# Patient Record
Sex: Female | Born: 1960 | Race: Black or African American | Hispanic: No | Marital: Single | Smoking: Never smoker
Health system: Southern US, Community
[De-identification: ages and names within clinical notes are randomized; demographics above are authoritative.]

## PROBLEM LIST (undated history)

## (undated) DIAGNOSIS — F419 Anxiety disorder, unspecified: Secondary | ICD-10-CM

## (undated) DIAGNOSIS — J45909 Unspecified asthma, uncomplicated: Secondary | ICD-10-CM

## (undated) DIAGNOSIS — Z9289 Personal history of other medical treatment: Secondary | ICD-10-CM

## (undated) DIAGNOSIS — R51 Headache: Secondary | ICD-10-CM

## (undated) DIAGNOSIS — F329 Major depressive disorder, single episode, unspecified: Secondary | ICD-10-CM

## (undated) DIAGNOSIS — I209 Angina pectoris, unspecified: Secondary | ICD-10-CM

## (undated) DIAGNOSIS — R519 Headache, unspecified: Secondary | ICD-10-CM

## (undated) DIAGNOSIS — D649 Anemia, unspecified: Secondary | ICD-10-CM

## (undated) DIAGNOSIS — R7303 Prediabetes: Secondary | ICD-10-CM

## (undated) DIAGNOSIS — I10 Essential (primary) hypertension: Secondary | ICD-10-CM

## (undated) DIAGNOSIS — F32A Depression, unspecified: Secondary | ICD-10-CM

## (undated) DIAGNOSIS — R011 Cardiac murmur, unspecified: Secondary | ICD-10-CM

---

## 1997-10-17 HISTORY — PX: INCISION AND DRAINAGE: SHX5863

## 2008-10-17 HISTORY — PX: OTHER SURGICAL HISTORY: SHX169

## 2010-10-17 DIAGNOSIS — Z9289 Personal history of other medical treatment: Secondary | ICD-10-CM

## 2010-10-17 HISTORY — DX: Personal history of other medical treatment: Z92.89

## 2016-06-28 ENCOUNTER — Encounter: Payer: Self-pay | Admitting: Internal Medicine

## 2016-07-19 ENCOUNTER — Encounter: Payer: Self-pay | Admitting: Internal Medicine

## 2016-08-03 ENCOUNTER — Encounter: Payer: Self-pay | Admitting: Internal Medicine

## 2016-08-11 ENCOUNTER — Encounter: Payer: Self-pay | Admitting: Obstetrics & Gynecology

## 2016-08-12 NOTE — Patient Instructions (Signed)
Your procedure is scheduled on:  Thursday, Nov. 9, 2017  Enter through the [employer] [facility] at:  11:30 AM  Pick up the phone at the desk and dial [phone].  Call this number if you have problems the morning of surgery: [phone].  Remember: Do NOT eat food:  After Midnight Wednesday, Nov. 8, 2017  Do NOT drink clear liquids after:  9:00 AM day of surgery  Take these medicines the morning of surgery with a SIP OF WATER:  None  Stop ALL herbal medications at this time   Do NOT wear jewelry (body piercing), metal hair clips/bobby pins, make-up, or nail polish. Do NOT wear lotions, powders, or perfumes.  You may wear deodorant. Do NOT shave for 48 hours prior to surgery. Do NOT bring valuables to the hospital. Contacts, dentures, or bridgework may not be worn into surgery.  Have a responsible adult drive you home and stay with you for 24 hours after your procedure

## 2016-08-15 ENCOUNTER — Inpatient Hospital Stay (HOSPITAL_COMMUNITY)
Admission: RE | Admit: 2016-08-15 | Discharge: 2016-08-15 | Disposition: A | Discharge status: Home / Self Care | Payer: Self-pay | Source: Ambulatory Visit

## 2016-08-22 NOTE — H&P (Signed)
Taylor Chen is an 55 y.o. female with postmenopausal bleeding.  Ultrasound shows 27mm endometrial polyp.    Pertinent Gynecological History: Menses: post-menopausal Bleeding: post menopausal bleeding Contraception: none DES exposure: unknown Blood transfusions: none Sexually transmitted diseases: no past history Previous GYN Procedures: DNC  Last mammogram: normal Date: 2016 Last pap: normal Date: 06/2016 OB History: G0   Menstrual History: Menarche age: n/a No LMP recorded.    No past medical history on file.  No past surgical history on file.  No family history on file.  Social History:  has no tobacco, alcohol, and drug history on file.  Allergies:  Allergies  Allergen Reactions  . Penicillins Other (See Comments)    Has patient had a PCN reaction causing immediate rash, facial/tongue/throat swelling, SOB or lightheadedness with hypotension: no Has patient had a PCN reaction causing severe rash involving mucus membranes or skin necrosis: no Has patient had a PCN reaction that required hospitalization no Has patient had a PCN reaction occurring within the last 10 years: no If all of the above answers are "NO", then may proceed with Cephalosporin use. Reaction: yeast infection     No prescriptions prior to admission.    ROS  There were no vitals taken for this visit. Physical Exam  Constitutional: She is oriented to person, place, and time. She appears well-developed and well-nourished.  GI: Soft. There is no rebound and no guarding.  Neurological: She is alert and oriented to person, place, and time.  Skin: Skin is warm and dry.  Psychiatric: She has a normal mood and affect. Her behavior is normal.    No results found for this or any previous visit (from the past 24 hour(s)).  No results found.  Assessment/Plan: 55yo with PMB -H/S, D&C possible myosure -Patient has been counseled re: risk of bleeding, infection, scarring, and damage to surrounding  structures.  All questions were answered and the patient wishes to proceed.  Sawyer Kahan, [place] 08/22/2016, 8:46 PM

## 2016-08-23 NOTE — Patient Instructions (Addendum)
Your procedure is scheduled on:  Tomorrow, Nov. 9, 2017  Enter through the [employer] [facility] at:  11:30 AM  Pick up the phone at the desk and dial [phone].  Call this number if you have problems the morning of surgery: [phone].  Remember: Do NOT eat food:  After Midnight tonight  Do NOT drink clear liquids after:  9:00 AM day of surgery  Take these medicines the morning of surgery with a SIP OF WATER:  None  Stop ALL herbal medications at this time   Do NOT wear jewelry (body piercing), metal hair clips/bobby pins, make-up, or nail polish. Do NOT wear lotions, powders, or perfumes.  You may wear deodorant. Do NOT shave for 48 hours prior to surgery. Do NOT bring valuables to the hospital. Contacts, dentures, or bridgework may not be worn into surgery.  Have a responsible adult drive you home and stay with you for 24 hours after your procedure/

## 2016-08-24 ENCOUNTER — Encounter (HOSPITAL_COMMUNITY)
Admission: RE | Admit: 2016-08-24 | Discharge: 2016-08-24 | Disposition: A | Discharge status: Home / Self Care | Payer: BLUE CROSS/BLUE SHIELD | Source: Ambulatory Visit | Attending: Obstetrics & Gynecology | Admitting: Obstetrics & Gynecology

## 2016-08-24 ENCOUNTER — Other Ambulatory Visit: Payer: Self-pay

## 2016-08-24 ENCOUNTER — Encounter (HOSPITAL_COMMUNITY): Payer: Self-pay

## 2016-08-24 DIAGNOSIS — N840 Polyp of corpus uteri: Secondary | ICD-10-CM | POA: Diagnosis not present

## 2016-08-24 DIAGNOSIS — N950 Postmenopausal bleeding: Secondary | ICD-10-CM | POA: Diagnosis present

## 2016-08-24 DIAGNOSIS — Z79899 Other long term (current) drug therapy: Secondary | ICD-10-CM | POA: Diagnosis not present

## 2016-08-24 DIAGNOSIS — I10 Essential (primary) hypertension: Secondary | ICD-10-CM | POA: Diagnosis not present

## 2016-08-24 HISTORY — DX: Cardiac murmur, unspecified: R01.1

## 2016-08-24 HISTORY — DX: Angina pectoris, unspecified: I20.9

## 2016-08-24 HISTORY — DX: Unspecified asthma, uncomplicated: J45.909

## 2016-08-24 HISTORY — DX: Anemia, unspecified: D64.9

## 2016-08-24 HISTORY — DX: Anxiety disorder, unspecified: F41.9

## 2016-08-24 HISTORY — DX: Headache: R51

## 2016-08-24 HISTORY — DX: Headache, unspecified: R51.9

## 2016-08-24 HISTORY — DX: Depression, unspecified: F32.A

## 2016-08-24 HISTORY — DX: Personal history of other medical treatment: Z92.89

## 2016-08-24 HISTORY — DX: Essential (primary) hypertension: I10

## 2016-08-24 HISTORY — DX: Major depressive disorder, single episode, unspecified: F32.9

## 2016-08-24 LAB — CBC
HEMATOCRIT: 38.7 % (ref 36.0–46.0)
HEMOGLOBIN: 12.9 g/dL (ref 12.0–15.0)
MCH: 30.4 pg (ref 26.0–34.0)
MCHC: 33.3 g/dL (ref 30.0–36.0)
MCV: 91.1 fL (ref 78.0–100.0)
Platelets: 383 10*3/uL (ref 150–400)
RBC: 4.25 MIL/uL (ref 3.87–5.11)
RDW: 13.8 % (ref 11.5–15.5)
WBC: 8 10*3/uL (ref 4.0–10.5)

## 2016-08-24 LAB — TYPE AND SCREEN
ABO/RH(D): O POS
ANTIBODY SCREEN: NEGATIVE

## 2016-08-24 LAB — ABO/RH: ABO/RH(D): O POS

## 2016-08-24 NOTE — Pre-Procedure Instructions (Signed)
Dr. Conrad  aware of patient's c/o chest pain, mid sternal over the last 2 days. He reviewed  EKG and and was okay with results.

## 2016-08-25 ENCOUNTER — Ambulatory Visit (HOSPITAL_COMMUNITY): Payer: BLUE CROSS/BLUE SHIELD | Admitting: Anesthesiology

## 2016-08-25 ENCOUNTER — Encounter (HOSPITAL_COMMUNITY): Payer: Self-pay

## 2016-08-25 ENCOUNTER — Encounter (HOSPITAL_COMMUNITY)
Admission: RE | Disposition: A | Discharge status: Home / Self Care | Payer: Self-pay | Source: Ambulatory Visit | Attending: Obstetrics & Gynecology

## 2016-08-25 ENCOUNTER — Ambulatory Visit (HOSPITAL_COMMUNITY)
Admission: RE | Admit: 2016-08-25 | Discharge: 2016-08-25 | Disposition: A | Discharge status: Home / Self Care | Payer: BLUE CROSS/BLUE SHIELD | Source: Ambulatory Visit | Attending: Obstetrics & Gynecology | Admitting: Obstetrics & Gynecology

## 2016-08-25 DIAGNOSIS — I10 Essential (primary) hypertension: Secondary | ICD-10-CM | POA: Insufficient documentation

## 2016-08-25 DIAGNOSIS — Z79899 Other long term (current) drug therapy: Secondary | ICD-10-CM | POA: Insufficient documentation

## 2016-08-25 DIAGNOSIS — N840 Polyp of corpus uteri: Secondary | ICD-10-CM | POA: Insufficient documentation

## 2016-08-25 HISTORY — PX: DILATATION & CURETTAGE/HYSTEROSCOPY WITH MYOSURE: SHX6511

## 2016-08-25 SURGERY — DILATATION & CURETTAGE/HYSTEROSCOPY WITH MYOSURE
Anesthesia: General

## 2016-08-25 MED ORDER — DEXAMETHASONE SODIUM PHOSPHATE 10 MG/ML IJ SOLN
INTRAMUSCULAR | Status: DC | PRN
  Administered 2016-08-25: 4 mg via INTRAVENOUS

## 2016-08-25 MED ORDER — OXYCODONE HCL 5 MG/5ML PO SOLN: 5.0000 mg | Freq: Once | ORAL | Status: DC | PRN

## 2016-08-25 MED ORDER — LACTATED RINGERS IV SOLN: INTRAVENOUS | Status: DC

## 2016-08-25 MED ORDER — OXYCODONE-ACETAMINOPHEN 5-325 MG PO TABS: 1.0000 | ORAL_TABLET | ORAL | 0 refills | Status: DC | PRN

## 2016-08-25 MED ORDER — ONDANSETRON HCL 4 MG/2ML IJ SOLN
INTRAMUSCULAR | Status: DC | PRN
  Administered 2016-08-25: 4 mg via INTRAVENOUS

## 2016-08-25 MED ORDER — SODIUM CHLORIDE 0.9 % IR SOLN
Status: DC | PRN
  Administered 2016-08-25: 3000 mL

## 2016-08-25 MED ORDER — LIDOCAINE HCL (CARDIAC) 20 MG/ML IV SOLN
INTRAVENOUS | Status: DC | PRN
  Administered 2016-08-25: 100 mg via INTRAVENOUS

## 2016-08-25 MED ORDER — SCOPOLAMINE 1 MG/3DAYS TD PT72: 1.0000 | MEDICATED_PATCH | Freq: Once | TRANSDERMAL | Status: DC

## 2016-08-25 MED ORDER — LIDOCAINE HCL (CARDIAC) 20 MG/ML IV SOLN
INTRAVENOUS | Status: AC
  Filled 2016-08-25: qty 5

## 2016-08-25 MED ORDER — ONDANSETRON HCL 4 MG/2ML IJ SOLN
INTRAMUSCULAR | Status: AC
  Filled 2016-08-25: qty 2

## 2016-08-25 MED ORDER — FENTANYL CITRATE (PF) 100 MCG/2ML IJ SOLN
INTRAMUSCULAR | Status: DC
Start: 2016-08-25 — End: 2016-08-25
  Filled 2016-08-25: qty 2

## 2016-08-25 MED ORDER — IBUPROFEN 800 MG PO TABS: 800.0000 mg | ORAL_TABLET | Freq: Four times a day (QID) | ORAL | 0 refills | Status: DC | PRN

## 2016-08-25 MED ORDER — FENTANYL CITRATE (PF) 100 MCG/2ML IJ SOLN
INTRAMUSCULAR | Status: DC | PRN
  Administered 2016-08-25: 25 ug via INTRAVENOUS
  Administered 2016-08-25: 50 ug via INTRAVENOUS
  Administered 2016-08-25: 25 ug via INTRAVENOUS

## 2016-08-25 MED ORDER — OXYCODONE HCL 5 MG PO TABS: 5.0000 mg | ORAL_TABLET | Freq: Once | ORAL | Status: DC | PRN

## 2016-08-25 MED ORDER — MIDAZOLAM HCL 2 MG/2ML IJ SOLN
INTRAMUSCULAR | Status: AC
  Filled 2016-08-25: qty 2

## 2016-08-25 MED ORDER — MIDAZOLAM HCL 2 MG/2ML IJ SOLN
INTRAMUSCULAR | Status: DC | PRN
  Administered 2016-08-25: 2 mg via INTRAVENOUS

## 2016-08-25 MED ORDER — PROPOFOL 10 MG/ML IV BOLUS
INTRAVENOUS | Status: DC | PRN
  Administered 2016-08-25: 200 mg via INTRAVENOUS

## 2016-08-25 MED ORDER — FENTANYL CITRATE (PF) 100 MCG/2ML IJ SOLN
INTRAMUSCULAR | Status: AC
  Filled 2016-08-25: qty 2

## 2016-08-25 MED ORDER — FENTANYL CITRATE (PF) 100 MCG/2ML IJ SOLN
25.0000 ug | INTRAMUSCULAR | Status: DC | PRN
  Administered 2016-08-25: 25 ug via INTRAVENOUS

## 2016-08-25 MED ORDER — PROPOFOL 10 MG/ML IV BOLUS
INTRAVENOUS | Status: AC
  Filled 2016-08-25: qty 20

## 2016-08-25 MED ORDER — DEXAMETHASONE SODIUM PHOSPHATE 4 MG/ML IJ SOLN
INTRAMUSCULAR | Status: AC
  Filled 2016-08-25: qty 1

## 2016-08-25 MED ORDER — ONDANSETRON HCL 4 MG/2ML IJ SOLN: 4.0000 mg | Freq: Four times a day (QID) | INTRAMUSCULAR | Status: DC | PRN

## 2016-08-25 MED ORDER — LACTATED RINGERS IV SOLN
INTRAVENOUS | Status: DC
  Administered 2016-08-25: 13:00:00 via INTRAVENOUS

## 2016-08-25 SURGICAL SUPPLY — 22 items
CANISTER SUCT 3000ML (MISCELLANEOUS) ×4 IMPLANT
CATH ROBINSON RED A/P 16FR (CATHETERS) ×2 IMPLANT
CLOTH BEACON ORANGE TIMEOUT ST (SAFETY) ×2 IMPLANT
CONTAINER PREFILL 10% NBF 60ML (FORM) ×4 IMPLANT
DEVICE MYOSURE LITE (MISCELLANEOUS) ×2 IMPLANT
DEVICE MYOSURE REACH (MISCELLANEOUS) IMPLANT
ELECT REM PT RETURN 9FT ADLT (ELECTROSURGICAL)
ELECTRODE REM PT RTRN 9FT ADLT (ELECTROSURGICAL) IMPLANT
FILTER ARTHROSCOPY CONVERTOR (FILTER) ×2 IMPLANT
GLOVE BIO SURGEON STRL SZ 6 (GLOVE) ×2 IMPLANT
GLOVE BIOGEL PI IND STRL 6 (GLOVE) ×1 IMPLANT
GLOVE BIOGEL PI IND STRL 7.0 (GLOVE) ×1 IMPLANT
GLOVE BIOGEL PI INDICATOR 6 (GLOVE) ×1
GLOVE BIOGEL PI INDICATOR 7.0 (GLOVE) ×1
GOWN STRL REUS W/TWL LRG LVL3 (GOWN DISPOSABLE) ×4 IMPLANT
PACK VAGINAL MINOR WOMEN LF (CUSTOM PROCEDURE TRAY) ×2 IMPLANT
PAD OB MATERNITY 4.3X12.25 (PERSONAL CARE ITEMS) ×2 IMPLANT
SEAL ROD LENS SCOPE MYOSURE (ABLATOR) ×2 IMPLANT
TOWEL OR 17X24 6PK STRL BLUE (TOWEL DISPOSABLE) ×4 IMPLANT
TUBING AQUILEX INFLOW (TUBING) ×2 IMPLANT
TUBING AQUILEX OUTFLOW (TUBING) ×2 IMPLANT
WATER STERILE IRR 1000ML POUR (IV SOLUTION) ×2 IMPLANT

## 2016-08-25 NOTE — Anesthesia Preprocedure Evaluation (Signed)
Anesthesia Evaluation  Patient identified by MRN, date of birth, ID band Patient awake    Reviewed: Allergy & Precautions, H&P , NPO status , Patient's Chart, lab work & pertinent test results  Airway Mallampati: II   Neck ROM: full    Dental   Pulmonary asthma ,    breath sounds clear to auscultation       Cardiovascular hypertension, + angina  Rhythm:regular Rate:Normal     Neuro/Psych  Headaches, PSYCHIATRIC DISORDERS Anxiety Depression    GI/Hepatic   Endo/Other    Renal/GU      Musculoskeletal   Abdominal   Peds  Hematology   Anesthesia Other Findings   Reproductive/Obstetrics                             Anesthesia Physical Anesthesia Plan  ASA: II  Anesthesia Plan: General   Post-op Pain Management:    Induction: Intravenous  Airway Management Planned: LMA  Additional Equipment:   Intra-op Plan:   Post-operative Plan:   Informed Consent: I have reviewed the patients History and Physical, chart, labs and discussed the procedure including the risks, benefits and alternatives for the proposed anesthesia with the patient or authorized representative who has indicated his/her understanding and acceptance.     Plan Discussed with: CRNA, Anesthesiologist and Surgeon  Anesthesia Plan Comments:         Anesthesia Quick Evaluation

## 2016-08-25 NOTE — Transfer of Care (Signed)
Immediate Anesthesia Transfer of Care Note  Patient: Taylor Chen  Procedure(s) Performed: Procedure(s): DILATATION & CURETTAGE/HYSTEROSCOPY WITH MYOSURE (N/A)  Patient Location: PACU  Anesthesia Type:General  Level of Consciousness: awake, alert  and oriented  Airway & Oxygen Therapy: Patient Spontanous Breathing and Patient connected to nasal cannula oxygen  Post-op Assessment: Report given to RN, Post -op Vital signs reviewed and stable and Patient moving all extremities  Post vital signs: Reviewed and stable  Last Vitals:  Vitals:   08/25/16 1146  BP: (!) 149/99  Pulse: 86  Resp: 18  Temp: 36.9 C    Last Pain:  Vitals:   08/25/16 1146  TempSrc: Oral      Patients Stated Pain Goal: 2 ([id number])  Complications: No apparent anesthesia complications

## 2016-08-25 NOTE — Progress Notes (Signed)
No change to H&P.  Taylor Baumgarten, DO 

## 2016-08-25 NOTE — Anesthesia Procedure Notes (Signed)
Procedure Name: LMA Insertion Date/Time: 08/25/2016 12:56 PM Performed by: Hewitt Blade Pre-anesthesia Checklist: Patient identified, Patient being monitored, Emergency Drugs available and Suction available Patient Re-evaluated:Patient Re-evaluated prior to inductionOxygen Delivery Method: Circle system utilized Preoxygenation: Pre-oxygenation with 100% oxygen Intubation Type: IV induction LMA: LMA inserted LMA Size: 4.0 Number of attempts: 1 Placement Confirmation: positive ETCO2 and breath sounds checked- equal and bilateral Tube secured with: Tape Dental Injury: Teeth and Oropharynx as per pre-operative assessment

## 2016-08-25 NOTE — Op Note (Signed)
PREOPERATIVE DIAGNOSIS:  55 y.o. with postmenopausal bleeding  POSTOPERATIVE DIAGNOSIS: The same  PROCEDURE: Hysteroscopy, Dilation and Curettage, Myosure LITE  SURGEON:  Dr. Linda Hedges  INDICATIONS: 55 y.o. here for scheduled surgery for Hysteroscopy, D&C, Myosure.   Risks of surgery were discussed with the patient including but not limited to: bleeding which may require transfusion; infection which may require antibiotics; injury to uterus or surrounding organs; intrauterine scarring which may impair future fertility; need for additional procedures including laparotomy or laparoscopy; and other postoperative/anesthesia complications. Written informed consent was obtained.    FINDINGS:  A 6 week size uterus.  Overall atrophic appearing endometrium with fibroid on anterior wall. Normal ostia bilaterally.  ANESTHESIA:   General  ESTIMATED BLOOD LOSS:  Less than 20 ml  SPECIMENS: Endometrial curettings and Myosure specimen sent to pathology  COMPLICATIONS:  None immediate.  PROCEDURE DETAILS:  The patient was taken to the operating room where general anesthesia was administered and was found to be adequate.  After an adequate timeout was performed, she was placed in the dorsal lithotomy position and examined; then prepped and draped in the sterile manner.   Her bladder was catheterized for an unmeasured amount of clear, yellow urine. A speculum was then placed in the patient's vagina and a single tooth tenaculum was applied to the anterior lip of the cervix. The uteus was sounded to 7 cm and dilated manually with metal dilators to accommodate the Myosure hysteroscope.  Once the cervix was dilated, the hysteroscope was inserted under direct visualization using saline as a suspension medium.  The uterine cavity was carefully examined, both ostia were recognized and the above listed findings were noted.   After further careful visualization of the uterine cavity, the hysteroscope was removed under  direct visualization. The Myosure LITE was advanced and used to remove the intracavitary fibroid.  Fluid balance was 300cc when complete resection was noted.  Hysteroscope was removed and a sharp curettage was then performed to obtain a small amount of endometrial curettings.  The tenaculum was removed from the anterior lip of the cervix and the vaginal speculum was removed after noting good hemostasis.  The patient tolerated the procedure well and was taken to the recovery area awake, extubated and in stable condition.

## 2016-08-25 NOTE — Discharge Instructions (Addendum)
Call MD for T>100.4, heavy vaginal bleeding, severe abdominal pain, intractable nausea and/or vomiting, or respiratory distress.  Call office to schedule postop appointment in 2 weeks.  No driving while taking narcotics.  Pelvic rest x 6 weeks  DISCHARGE INSTRUCTIONS: HYSTEROSCOPY / ENDOMETRIAL ABLATION The following instructions have been prepared to help you care for yourself upon your return home.  May Remove Scop patch on or before: 11/12   May take stool softner while taking narcotic pain medication to prevent constipation.  Drink plenty of water.  Personal hygiene:  Use sanitary pads for vaginal drainage, not tampons.  Shower the day after your procedure.  NO tub baths, pools or Jacuzzis for 2-3 weeks.  Wipe front to back after using the bathroom.  Activity and limitations:  Do NOT drive or operate any equipment for 24 hours. The effects of anesthesia are still present and drowsiness may result.  Do NOT rest in bed all day.  Walking is encouraged.  Walk up and down stairs slowly.  You may resume your normal activity in one to two days or as indicated by your physician. Sexual activity: NO intercourse for at least 2 weeks after the procedure, or as indicated by your Doctor.  Diet: Eat a light meal as desired this evening. You may resume your usual diet tomorrow.  Return to Work: You may resume your work activities in one to two days or as indicated by Marine scientist.  What to expect after your surgery: Expect to have vaginal bleeding/discharge for 2-3 days and spotting for up to 10 days. It is not unusual to have soreness for up to 1-2 weeks. You may have a slight burning sensation when you urinate for the first day. Mild cramps may continue for a couple of days. You may have a regular period in 2-6 weeks.  Call your doctor for any of the following:  Excessive vaginal bleeding or clotting, saturating and changing one pad every hour.  Inability to urinate 6 hours  after discharge from hospital.  Pain not relieved by pain medication.  Fever of 100.4 F or greater.  Unusual vaginal discharge or odor.  Return to office _________________Call for an appointment ___________________ Patients signature: ______________________ Nurses signature ________________________  Post Anesthesia Care Unit [phone]   Post Anesthesia Home Care Instructions  Activity: Get plenty of rest for the remainder of the day. A responsible adult should stay with you for 24 hours following the procedure.  For the next 24 hours, DO NOT: -Drive a car -Paediatric nurse -Drink alcoholic beverages -Take any medication unless instructed by your physician -Make any legal decisions or sign important papers.  Meals: Start with liquid foods such as gelatin or soup. Progress to regular foods as tolerated. Avoid greasy, spicy, heavy foods. If nausea and/or vomiting occur, drink only clear liquids until the nausea and/or vomiting subsides. Call your physician if vomiting continues.  Special Instructions/Symptoms: Your throat may feel dry or sore from the anesthesia or the breathing tube placed in your throat during surgery. If this causes discomfort, gargle with warm salt water. The discomfort should disappear within 24 hours.  If you had a scopolamine patch placed behind your ear for the management of post- operative nausea and/or vomiting:  1. The medication in the patch is effective for 72 hours, after which it should be removed.  Wrap patch in a tissue and discard in the trash. Wash hands thoroughly with soap and water. 2. You may remove the patch earlier than 72 hours if  you experience unpleasant side effects which may include dry mouth, dizziness or visual disturbances. 3. Avoid touching the patch. Wash your hands with soap and water after contact with the patch.   Marland Kitchen

## 2016-08-26 NOTE — Anesthesia Postprocedure Evaluation (Signed)
Anesthesia Post Note  Patient: Taylor Chen  Procedure(s) Performed: Procedure(s) (LRB): DILATATION & CURETTAGE/HYSTEROSCOPY WITH MYOSURE (N/A)  Patient location during evaluation: PACU Anesthesia Type: General Level of consciousness: awake and alert and patient cooperative Pain management: pain level controlled Vital Signs Assessment: post-procedure vital signs reviewed and stable Respiratory status: spontaneous breathing and respiratory function stable Cardiovascular status: stable Anesthetic complications: no    Last Vitals:  Vitals:   08/25/16 1430 08/25/16 1520  BP: (!) 150/96 (!) 142/85  Pulse: 83 68  Resp: 11 16  Temp: 36.7 C 36.6 C    Last Pain:  Vitals:   08/25/16 1520  TempSrc:   PainSc: 0-No pain                 Tekeshia Klahr S

## 2016-08-28 ENCOUNTER — Encounter (HOSPITAL_COMMUNITY): Payer: Self-pay | Admitting: Obstetrics & Gynecology

## 2016-09-28 ENCOUNTER — Encounter: Payer: Self-pay | Admitting: Internal Medicine

## 2017-05-26 ENCOUNTER — Ambulatory Visit: Payer: BLUE CROSS/BLUE SHIELD | Admitting: Psychology

## 2017-06-02 ENCOUNTER — Ambulatory Visit: Payer: BLUE CROSS/BLUE SHIELD | Admitting: Psychology

## 2017-06-02 ENCOUNTER — Ambulatory Visit (INDEPENDENT_AMBULATORY_CARE_PROVIDER_SITE_OTHER): Payer: BLUE CROSS/BLUE SHIELD | Admitting: Psychology

## 2017-06-02 DIAGNOSIS — F331 Major depressive disorder, recurrent, moderate: Secondary | ICD-10-CM | POA: Diagnosis not present

## 2017-07-21 ENCOUNTER — Ambulatory Visit (INDEPENDENT_AMBULATORY_CARE_PROVIDER_SITE_OTHER): Payer: BLUE CROSS/BLUE SHIELD | Admitting: Psychology

## 2017-07-21 DIAGNOSIS — F331 Major depressive disorder, recurrent, moderate: Secondary | ICD-10-CM | POA: Diagnosis not present

## 2017-07-26 DIAGNOSIS — F411 Generalized anxiety disorder: Secondary | ICD-10-CM | POA: Diagnosis not present

## 2017-07-28 ENCOUNTER — Ambulatory Visit: Payer: BLUE CROSS/BLUE SHIELD | Admitting: Psychology

## 2017-08-04 ENCOUNTER — Ambulatory Visit: Payer: Self-pay | Admitting: Psychology

## 2017-08-11 ENCOUNTER — Ambulatory Visit: Payer: Self-pay | Admitting: Psychology

## 2017-08-18 ENCOUNTER — Ambulatory Visit: Payer: Self-pay | Admitting: Psychology

## 2017-08-23 DIAGNOSIS — F411 Generalized anxiety disorder: Secondary | ICD-10-CM | POA: Diagnosis not present

## 2017-08-25 ENCOUNTER — Ambulatory Visit: Payer: Self-pay | Admitting: Psychology

## 2017-08-25 DIAGNOSIS — H2513 Age-related nuclear cataract, bilateral: Secondary | ICD-10-CM | POA: Diagnosis not present

## 2017-08-25 DIAGNOSIS — H40022 Open angle with borderline findings, high risk, left eye: Secondary | ICD-10-CM | POA: Diagnosis not present

## 2017-08-25 DIAGNOSIS — H1013 Acute atopic conjunctivitis, bilateral: Secondary | ICD-10-CM | POA: Diagnosis not present

## 2017-08-25 DIAGNOSIS — H40023 Open angle with borderline findings, high risk, bilateral: Secondary | ICD-10-CM | POA: Diagnosis not present

## 2017-08-25 DIAGNOSIS — H25013 Cortical age-related cataract, bilateral: Secondary | ICD-10-CM | POA: Diagnosis not present

## 2017-08-25 DIAGNOSIS — H40021 Open angle with borderline findings, high risk, right eye: Secondary | ICD-10-CM | POA: Diagnosis not present

## 2017-09-01 ENCOUNTER — Ambulatory Visit: Payer: Self-pay | Admitting: Psychology

## 2017-09-08 ENCOUNTER — Ambulatory Visit: Payer: Self-pay | Admitting: Psychology

## 2017-09-15 ENCOUNTER — Ambulatory Visit: Payer: Self-pay | Admitting: Psychology

## 2017-09-15 ENCOUNTER — Ambulatory Visit: Payer: BLUE CROSS/BLUE SHIELD | Admitting: Psychology

## 2017-09-22 ENCOUNTER — Ambulatory Visit: Payer: BLUE CROSS/BLUE SHIELD | Admitting: Psychology

## 2017-09-29 ENCOUNTER — Ambulatory Visit: Payer: Self-pay | Admitting: Psychology

## 2017-10-04 DIAGNOSIS — F411 Generalized anxiety disorder: Secondary | ICD-10-CM | POA: Diagnosis not present

## 2017-10-06 ENCOUNTER — Ambulatory Visit: Payer: Self-pay | Admitting: Psychology

## 2017-10-06 DIAGNOSIS — I10 Essential (primary) hypertension: Secondary | ICD-10-CM | POA: Diagnosis not present

## 2017-10-06 DIAGNOSIS — E7800 Pure hypercholesterolemia, unspecified: Secondary | ICD-10-CM | POA: Diagnosis not present

## 2017-10-06 DIAGNOSIS — Z1231 Encounter for screening mammogram for malignant neoplasm of breast: Secondary | ICD-10-CM | POA: Diagnosis not present

## 2017-10-11 ENCOUNTER — Other Ambulatory Visit: Payer: Self-pay | Admitting: Family Medicine

## 2017-10-11 DIAGNOSIS — Z139 Encounter for screening, unspecified: Secondary | ICD-10-CM

## 2017-10-13 ENCOUNTER — Ambulatory Visit: Payer: Self-pay | Admitting: Psychology

## 2017-10-18 DIAGNOSIS — F411 Generalized anxiety disorder: Secondary | ICD-10-CM | POA: Diagnosis not present

## 2017-10-25 DIAGNOSIS — F411 Generalized anxiety disorder: Secondary | ICD-10-CM | POA: Diagnosis not present

## 2017-10-30 DIAGNOSIS — F411 Generalized anxiety disorder: Secondary | ICD-10-CM | POA: Diagnosis not present

## 2017-11-06 ENCOUNTER — Ambulatory Visit: Payer: Self-pay

## 2017-11-22 ENCOUNTER — Ambulatory Visit
Admission: RE | Admit: 2017-11-22 | Discharge: 2017-11-22 | Disposition: A | Discharge status: Home / Self Care | Payer: BLUE CROSS/BLUE SHIELD | Source: Ambulatory Visit | Attending: Family Medicine | Admitting: Family Medicine

## 2017-11-22 ENCOUNTER — Other Ambulatory Visit: Payer: Self-pay | Admitting: Family Medicine

## 2017-11-22 DIAGNOSIS — Z139 Encounter for screening, unspecified: Secondary | ICD-10-CM

## 2017-11-22 DIAGNOSIS — Z1231 Encounter for screening mammogram for malignant neoplasm of breast: Secondary | ICD-10-CM | POA: Diagnosis not present

## 2017-12-19 DIAGNOSIS — S058X2A Other injuries of left eye and orbit, initial encounter: Secondary | ICD-10-CM | POA: Diagnosis not present

## 2018-01-26 DIAGNOSIS — F4310 Post-traumatic stress disorder, unspecified: Secondary | ICD-10-CM | POA: Diagnosis not present

## 2018-02-23 DIAGNOSIS — H40023 Open angle with borderline findings, high risk, bilateral: Secondary | ICD-10-CM | POA: Diagnosis not present

## 2018-02-23 DIAGNOSIS — F4310 Post-traumatic stress disorder, unspecified: Secondary | ICD-10-CM | POA: Diagnosis not present

## 2018-04-09 DIAGNOSIS — Z1211 Encounter for screening for malignant neoplasm of colon: Secondary | ICD-10-CM | POA: Diagnosis not present

## 2018-04-09 DIAGNOSIS — E7800 Pure hypercholesterolemia, unspecified: Secondary | ICD-10-CM | POA: Diagnosis not present

## 2018-04-09 DIAGNOSIS — F439 Reaction to severe stress, unspecified: Secondary | ICD-10-CM | POA: Diagnosis not present

## 2018-04-09 DIAGNOSIS — I10 Essential (primary) hypertension: Secondary | ICD-10-CM | POA: Diagnosis not present

## 2018-04-16 DIAGNOSIS — Z01419 Encounter for gynecological examination (general) (routine) without abnormal findings: Secondary | ICD-10-CM | POA: Diagnosis not present

## 2018-04-16 DIAGNOSIS — Z6831 Body mass index (BMI) 31.0-31.9, adult: Secondary | ICD-10-CM | POA: Diagnosis not present

## 2018-08-02 DIAGNOSIS — F332 Major depressive disorder, recurrent severe without psychotic features: Secondary | ICD-10-CM | POA: Diagnosis not present

## 2018-08-02 DIAGNOSIS — F411 Generalized anxiety disorder: Secondary | ICD-10-CM | POA: Diagnosis not present

## 2018-08-22 DIAGNOSIS — F411 Generalized anxiety disorder: Secondary | ICD-10-CM | POA: Diagnosis not present

## 2018-08-22 DIAGNOSIS — F332 Major depressive disorder, recurrent severe without psychotic features: Secondary | ICD-10-CM | POA: Diagnosis not present

## 2018-10-02 DIAGNOSIS — F332 Major depressive disorder, recurrent severe without psychotic features: Secondary | ICD-10-CM | POA: Diagnosis not present

## 2018-10-02 DIAGNOSIS — F411 Generalized anxiety disorder: Secondary | ICD-10-CM | POA: Diagnosis not present

## 2018-10-05 DIAGNOSIS — H25013 Cortical age-related cataract, bilateral: Secondary | ICD-10-CM | POA: Diagnosis not present

## 2018-10-05 DIAGNOSIS — H2513 Age-related nuclear cataract, bilateral: Secondary | ICD-10-CM | POA: Diagnosis not present

## 2018-10-05 DIAGNOSIS — H40023 Open angle with borderline findings, high risk, bilateral: Secondary | ICD-10-CM | POA: Diagnosis not present

## 2018-10-05 DIAGNOSIS — H524 Presbyopia: Secondary | ICD-10-CM | POA: Diagnosis not present

## 2018-10-05 DIAGNOSIS — H1013 Acute atopic conjunctivitis, bilateral: Secondary | ICD-10-CM | POA: Diagnosis not present

## 2018-10-18 DIAGNOSIS — F4323 Adjustment disorder with mixed anxiety and depressed mood: Secondary | ICD-10-CM | POA: Diagnosis not present

## 2018-10-23 ENCOUNTER — Other Ambulatory Visit: Payer: Self-pay | Admitting: Family Medicine

## 2018-10-23 DIAGNOSIS — Z1231 Encounter for screening mammogram for malignant neoplasm of breast: Secondary | ICD-10-CM

## 2018-10-23 DIAGNOSIS — F4323 Adjustment disorder with mixed anxiety and depressed mood: Secondary | ICD-10-CM | POA: Diagnosis not present

## 2018-10-29 DIAGNOSIS — F4323 Adjustment disorder with mixed anxiety and depressed mood: Secondary | ICD-10-CM | POA: Diagnosis not present

## 2018-10-31 DIAGNOSIS — F332 Major depressive disorder, recurrent severe without psychotic features: Secondary | ICD-10-CM | POA: Diagnosis not present

## 2018-10-31 DIAGNOSIS — F411 Generalized anxiety disorder: Secondary | ICD-10-CM | POA: Diagnosis not present

## 2018-11-05 DIAGNOSIS — F4323 Adjustment disorder with mixed anxiety and depressed mood: Secondary | ICD-10-CM | POA: Diagnosis not present

## 2018-11-14 DIAGNOSIS — F332 Major depressive disorder, recurrent severe without psychotic features: Secondary | ICD-10-CM | POA: Diagnosis not present

## 2018-11-14 DIAGNOSIS — F411 Generalized anxiety disorder: Secondary | ICD-10-CM | POA: Diagnosis not present

## 2018-11-19 DIAGNOSIS — F4323 Adjustment disorder with mixed anxiety and depressed mood: Secondary | ICD-10-CM | POA: Diagnosis not present

## 2018-11-23 ENCOUNTER — Ambulatory Visit
Admission: RE | Admit: 2018-11-23 | Discharge: 2018-11-23 | Disposition: A | Discharge status: Home / Self Care | Payer: BLUE CROSS/BLUE SHIELD | Source: Ambulatory Visit | Attending: Family Medicine | Admitting: Family Medicine

## 2018-11-23 DIAGNOSIS — Z1231 Encounter for screening mammogram for malignant neoplasm of breast: Secondary | ICD-10-CM

## 2018-11-23 DIAGNOSIS — F4323 Adjustment disorder with mixed anxiety and depressed mood: Secondary | ICD-10-CM | POA: Diagnosis not present

## 2018-11-26 DIAGNOSIS — F4323 Adjustment disorder with mixed anxiety and depressed mood: Secondary | ICD-10-CM | POA: Diagnosis not present

## 2018-11-29 DIAGNOSIS — F411 Generalized anxiety disorder: Secondary | ICD-10-CM | POA: Diagnosis not present

## 2018-11-29 DIAGNOSIS — F332 Major depressive disorder, recurrent severe without psychotic features: Secondary | ICD-10-CM | POA: Diagnosis not present

## 2018-12-03 DIAGNOSIS — F4323 Adjustment disorder with mixed anxiety and depressed mood: Secondary | ICD-10-CM | POA: Diagnosis not present

## 2018-12-06 DIAGNOSIS — F332 Major depressive disorder, recurrent severe without psychotic features: Secondary | ICD-10-CM | POA: Diagnosis not present

## 2018-12-06 DIAGNOSIS — F411 Generalized anxiety disorder: Secondary | ICD-10-CM | POA: Diagnosis not present

## 2018-12-10 DIAGNOSIS — F4323 Adjustment disorder with mixed anxiety and depressed mood: Secondary | ICD-10-CM | POA: Diagnosis not present

## 2018-12-28 DIAGNOSIS — E7800 Pure hypercholesterolemia, unspecified: Secondary | ICD-10-CM | POA: Diagnosis not present

## 2018-12-28 DIAGNOSIS — M79672 Pain in left foot: Secondary | ICD-10-CM | POA: Diagnosis not present

## 2018-12-28 DIAGNOSIS — I10 Essential (primary) hypertension: Secondary | ICD-10-CM | POA: Diagnosis not present

## 2019-01-03 DIAGNOSIS — F332 Major depressive disorder, recurrent severe without psychotic features: Secondary | ICD-10-CM | POA: Diagnosis not present

## 2019-01-03 DIAGNOSIS — F411 Generalized anxiety disorder: Secondary | ICD-10-CM | POA: Diagnosis not present

## 2019-01-10 DIAGNOSIS — F332 Major depressive disorder, recurrent severe without psychotic features: Secondary | ICD-10-CM | POA: Diagnosis not present

## 2019-01-10 DIAGNOSIS — F411 Generalized anxiety disorder: Secondary | ICD-10-CM | POA: Diagnosis not present

## 2019-01-15 DIAGNOSIS — F4323 Adjustment disorder with mixed anxiety and depressed mood: Secondary | ICD-10-CM | POA: Diagnosis not present

## 2019-01-17 DIAGNOSIS — F411 Generalized anxiety disorder: Secondary | ICD-10-CM | POA: Diagnosis not present

## 2019-01-17 DIAGNOSIS — F332 Major depressive disorder, recurrent severe without psychotic features: Secondary | ICD-10-CM | POA: Diagnosis not present

## 2019-01-21 DIAGNOSIS — F4323 Adjustment disorder with mixed anxiety and depressed mood: Secondary | ICD-10-CM | POA: Diagnosis not present

## 2019-01-28 DIAGNOSIS — F4323 Adjustment disorder with mixed anxiety and depressed mood: Secondary | ICD-10-CM | POA: Diagnosis not present

## 2019-01-31 DIAGNOSIS — M722 Plantar fascial fibromatosis: Secondary | ICD-10-CM | POA: Diagnosis not present

## 2019-01-31 DIAGNOSIS — M79672 Pain in left foot: Secondary | ICD-10-CM | POA: Diagnosis not present

## 2019-02-04 DIAGNOSIS — F4323 Adjustment disorder with mixed anxiety and depressed mood: Secondary | ICD-10-CM | POA: Diagnosis not present

## 2019-02-07 DIAGNOSIS — F411 Generalized anxiety disorder: Secondary | ICD-10-CM | POA: Diagnosis not present

## 2019-02-07 DIAGNOSIS — F332 Major depressive disorder, recurrent severe without psychotic features: Secondary | ICD-10-CM | POA: Diagnosis not present

## 2019-02-09 DIAGNOSIS — F4323 Adjustment disorder with mixed anxiety and depressed mood: Secondary | ICD-10-CM | POA: Diagnosis not present

## 2019-02-11 DIAGNOSIS — F4323 Adjustment disorder with mixed anxiety and depressed mood: Secondary | ICD-10-CM | POA: Diagnosis not present

## 2019-02-14 DIAGNOSIS — F332 Major depressive disorder, recurrent severe without psychotic features: Secondary | ICD-10-CM | POA: Diagnosis not present

## 2019-02-14 DIAGNOSIS — F411 Generalized anxiety disorder: Secondary | ICD-10-CM | POA: Diagnosis not present

## 2019-02-21 DIAGNOSIS — F411 Generalized anxiety disorder: Secondary | ICD-10-CM | POA: Diagnosis not present

## 2019-02-21 DIAGNOSIS — F332 Major depressive disorder, recurrent severe without psychotic features: Secondary | ICD-10-CM | POA: Diagnosis not present

## 2019-02-28 DIAGNOSIS — F332 Major depressive disorder, recurrent severe without psychotic features: Secondary | ICD-10-CM | POA: Diagnosis not present

## 2019-02-28 DIAGNOSIS — F411 Generalized anxiety disorder: Secondary | ICD-10-CM | POA: Diagnosis not present

## 2019-03-07 DIAGNOSIS — F411 Generalized anxiety disorder: Secondary | ICD-10-CM | POA: Diagnosis not present

## 2019-03-07 DIAGNOSIS — F332 Major depressive disorder, recurrent severe without psychotic features: Secondary | ICD-10-CM | POA: Diagnosis not present

## 2019-03-14 DIAGNOSIS — F411 Generalized anxiety disorder: Secondary | ICD-10-CM | POA: Diagnosis not present

## 2019-03-14 DIAGNOSIS — F332 Major depressive disorder, recurrent severe without psychotic features: Secondary | ICD-10-CM | POA: Diagnosis not present

## 2019-03-21 DIAGNOSIS — F332 Major depressive disorder, recurrent severe without psychotic features: Secondary | ICD-10-CM | POA: Diagnosis not present

## 2019-03-21 DIAGNOSIS — F411 Generalized anxiety disorder: Secondary | ICD-10-CM | POA: Diagnosis not present

## 2019-03-25 DIAGNOSIS — M79672 Pain in left foot: Secondary | ICD-10-CM | POA: Diagnosis not present

## 2019-03-28 DIAGNOSIS — F411 Generalized anxiety disorder: Secondary | ICD-10-CM | POA: Diagnosis not present

## 2019-03-28 DIAGNOSIS — F332 Major depressive disorder, recurrent severe without psychotic features: Secondary | ICD-10-CM | POA: Diagnosis not present

## 2019-04-01 DIAGNOSIS — H40023 Open angle with borderline findings, high risk, bilateral: Secondary | ICD-10-CM | POA: Diagnosis not present

## 2019-04-01 DIAGNOSIS — H25013 Cortical age-related cataract, bilateral: Secondary | ICD-10-CM | POA: Diagnosis not present

## 2019-04-01 DIAGNOSIS — F411 Generalized anxiety disorder: Secondary | ICD-10-CM | POA: Diagnosis not present

## 2019-04-01 DIAGNOSIS — F332 Major depressive disorder, recurrent severe without psychotic features: Secondary | ICD-10-CM | POA: Diagnosis not present

## 2019-04-04 DIAGNOSIS — F332 Major depressive disorder, recurrent severe without psychotic features: Secondary | ICD-10-CM | POA: Diagnosis not present

## 2019-04-04 DIAGNOSIS — F411 Generalized anxiety disorder: Secondary | ICD-10-CM | POA: Diagnosis not present

## 2019-04-08 DIAGNOSIS — F332 Major depressive disorder, recurrent severe without psychotic features: Secondary | ICD-10-CM | POA: Diagnosis not present

## 2019-04-08 DIAGNOSIS — F411 Generalized anxiety disorder: Secondary | ICD-10-CM | POA: Diagnosis not present

## 2019-04-11 DIAGNOSIS — F411 Generalized anxiety disorder: Secondary | ICD-10-CM | POA: Diagnosis not present

## 2019-04-11 DIAGNOSIS — F332 Major depressive disorder, recurrent severe without psychotic features: Secondary | ICD-10-CM | POA: Diagnosis not present

## 2019-04-25 DIAGNOSIS — F411 Generalized anxiety disorder: Secondary | ICD-10-CM | POA: Diagnosis not present

## 2019-04-25 DIAGNOSIS — F332 Major depressive disorder, recurrent severe without psychotic features: Secondary | ICD-10-CM | POA: Diagnosis not present

## 2019-04-26 DIAGNOSIS — M549 Dorsalgia, unspecified: Secondary | ICD-10-CM | POA: Diagnosis not present

## 2019-04-26 DIAGNOSIS — M545 Low back pain: Secondary | ICD-10-CM | POA: Diagnosis not present

## 2019-04-29 DIAGNOSIS — F332 Major depressive disorder, recurrent severe without psychotic features: Secondary | ICD-10-CM | POA: Diagnosis not present

## 2019-04-29 DIAGNOSIS — F411 Generalized anxiety disorder: Secondary | ICD-10-CM | POA: Diagnosis not present

## 2019-05-02 DIAGNOSIS — M546 Pain in thoracic spine: Secondary | ICD-10-CM | POA: Diagnosis not present

## 2019-05-02 DIAGNOSIS — M542 Cervicalgia: Secondary | ICD-10-CM | POA: Diagnosis not present

## 2019-05-02 DIAGNOSIS — M545 Low back pain: Secondary | ICD-10-CM | POA: Diagnosis not present

## 2019-05-02 DIAGNOSIS — F411 Generalized anxiety disorder: Secondary | ICD-10-CM | POA: Diagnosis not present

## 2019-05-02 DIAGNOSIS — F332 Major depressive disorder, recurrent severe without psychotic features: Secondary | ICD-10-CM | POA: Diagnosis not present

## 2019-05-02 DIAGNOSIS — M25531 Pain in right wrist: Secondary | ICD-10-CM | POA: Diagnosis not present

## 2019-05-06 DIAGNOSIS — F332 Major depressive disorder, recurrent severe without psychotic features: Secondary | ICD-10-CM | POA: Diagnosis not present

## 2019-05-06 DIAGNOSIS — F411 Generalized anxiety disorder: Secondary | ICD-10-CM | POA: Diagnosis not present

## 2019-05-09 DIAGNOSIS — F332 Major depressive disorder, recurrent severe without psychotic features: Secondary | ICD-10-CM | POA: Diagnosis not present

## 2019-05-09 DIAGNOSIS — F411 Generalized anxiety disorder: Secondary | ICD-10-CM | POA: Diagnosis not present

## 2019-05-13 DIAGNOSIS — F411 Generalized anxiety disorder: Secondary | ICD-10-CM | POA: Diagnosis not present

## 2019-05-13 DIAGNOSIS — F332 Major depressive disorder, recurrent severe without psychotic features: Secondary | ICD-10-CM | POA: Diagnosis not present

## 2019-05-16 DIAGNOSIS — F411 Generalized anxiety disorder: Secondary | ICD-10-CM | POA: Diagnosis not present

## 2019-05-16 DIAGNOSIS — F332 Major depressive disorder, recurrent severe without psychotic features: Secondary | ICD-10-CM | POA: Diagnosis not present

## 2019-05-23 DIAGNOSIS — F411 Generalized anxiety disorder: Secondary | ICD-10-CM | POA: Diagnosis not present

## 2019-05-23 DIAGNOSIS — F332 Major depressive disorder, recurrent severe without psychotic features: Secondary | ICD-10-CM | POA: Diagnosis not present

## 2019-05-30 DIAGNOSIS — F332 Major depressive disorder, recurrent severe without psychotic features: Secondary | ICD-10-CM | POA: Diagnosis not present

## 2019-05-30 DIAGNOSIS — F411 Generalized anxiety disorder: Secondary | ICD-10-CM | POA: Diagnosis not present

## 2019-06-03 DIAGNOSIS — F411 Generalized anxiety disorder: Secondary | ICD-10-CM | POA: Diagnosis not present

## 2019-06-03 DIAGNOSIS — F332 Major depressive disorder, recurrent severe without psychotic features: Secondary | ICD-10-CM | POA: Diagnosis not present

## 2019-06-06 DIAGNOSIS — F411 Generalized anxiety disorder: Secondary | ICD-10-CM | POA: Diagnosis not present

## 2019-06-06 DIAGNOSIS — F332 Major depressive disorder, recurrent severe without psychotic features: Secondary | ICD-10-CM | POA: Diagnosis not present

## 2019-06-10 DIAGNOSIS — F332 Major depressive disorder, recurrent severe without psychotic features: Secondary | ICD-10-CM | POA: Diagnosis not present

## 2019-06-10 DIAGNOSIS — F411 Generalized anxiety disorder: Secondary | ICD-10-CM | POA: Diagnosis not present

## 2019-06-13 DIAGNOSIS — F332 Major depressive disorder, recurrent severe without psychotic features: Secondary | ICD-10-CM | POA: Diagnosis not present

## 2019-06-13 DIAGNOSIS — F411 Generalized anxiety disorder: Secondary | ICD-10-CM | POA: Diagnosis not present

## 2019-06-17 DIAGNOSIS — F332 Major depressive disorder, recurrent severe without psychotic features: Secondary | ICD-10-CM | POA: Diagnosis not present

## 2019-06-17 DIAGNOSIS — F411 Generalized anxiety disorder: Secondary | ICD-10-CM | POA: Diagnosis not present

## 2019-06-20 DIAGNOSIS — F332 Major depressive disorder, recurrent severe without psychotic features: Secondary | ICD-10-CM | POA: Diagnosis not present

## 2019-06-20 DIAGNOSIS — F411 Generalized anxiety disorder: Secondary | ICD-10-CM | POA: Diagnosis not present

## 2019-06-27 DIAGNOSIS — F332 Major depressive disorder, recurrent severe without psychotic features: Secondary | ICD-10-CM | POA: Diagnosis not present

## 2019-06-27 DIAGNOSIS — F411 Generalized anxiety disorder: Secondary | ICD-10-CM | POA: Diagnosis not present

## 2019-07-01 DIAGNOSIS — I10 Essential (primary) hypertension: Secondary | ICD-10-CM | POA: Diagnosis not present

## 2019-07-01 DIAGNOSIS — E7800 Pure hypercholesterolemia, unspecified: Secondary | ICD-10-CM | POA: Diagnosis not present

## 2019-07-01 DIAGNOSIS — F332 Major depressive disorder, recurrent severe without psychotic features: Secondary | ICD-10-CM | POA: Diagnosis not present

## 2019-07-01 DIAGNOSIS — M546 Pain in thoracic spine: Secondary | ICD-10-CM | POA: Diagnosis not present

## 2019-07-01 DIAGNOSIS — F411 Generalized anxiety disorder: Secondary | ICD-10-CM | POA: Diagnosis not present

## 2019-07-08 DIAGNOSIS — F411 Generalized anxiety disorder: Secondary | ICD-10-CM | POA: Diagnosis not present

## 2019-07-08 DIAGNOSIS — F332 Major depressive disorder, recurrent severe without psychotic features: Secondary | ICD-10-CM | POA: Diagnosis not present

## 2019-07-09 DIAGNOSIS — Z23 Encounter for immunization: Secondary | ICD-10-CM | POA: Diagnosis not present

## 2019-07-09 DIAGNOSIS — E7800 Pure hypercholesterolemia, unspecified: Secondary | ICD-10-CM | POA: Diagnosis not present

## 2019-07-09 DIAGNOSIS — I10 Essential (primary) hypertension: Secondary | ICD-10-CM | POA: Diagnosis not present

## 2019-07-10 DIAGNOSIS — Z1211 Encounter for screening for malignant neoplasm of colon: Secondary | ICD-10-CM | POA: Diagnosis not present

## 2019-07-11 DIAGNOSIS — F332 Major depressive disorder, recurrent severe without psychotic features: Secondary | ICD-10-CM | POA: Diagnosis not present

## 2019-07-11 DIAGNOSIS — F411 Generalized anxiety disorder: Secondary | ICD-10-CM | POA: Diagnosis not present

## 2019-07-16 DIAGNOSIS — A499 Bacterial infection, unspecified: Secondary | ICD-10-CM | POA: Diagnosis not present

## 2019-07-16 DIAGNOSIS — K130 Diseases of lips: Secondary | ICD-10-CM | POA: Diagnosis not present

## 2019-07-18 DIAGNOSIS — F411 Generalized anxiety disorder: Secondary | ICD-10-CM | POA: Diagnosis not present

## 2019-07-18 DIAGNOSIS — F332 Major depressive disorder, recurrent severe without psychotic features: Secondary | ICD-10-CM | POA: Diagnosis not present

## 2019-07-30 DIAGNOSIS — F411 Generalized anxiety disorder: Secondary | ICD-10-CM | POA: Diagnosis not present

## 2019-08-01 DIAGNOSIS — F332 Major depressive disorder, recurrent severe without psychotic features: Secondary | ICD-10-CM | POA: Diagnosis not present

## 2019-08-01 DIAGNOSIS — F411 Generalized anxiety disorder: Secondary | ICD-10-CM | POA: Diagnosis not present

## 2019-08-05 DIAGNOSIS — F332 Major depressive disorder, recurrent severe without psychotic features: Secondary | ICD-10-CM | POA: Diagnosis not present

## 2019-08-05 DIAGNOSIS — F411 Generalized anxiety disorder: Secondary | ICD-10-CM | POA: Diagnosis not present

## 2019-08-08 DIAGNOSIS — F332 Major depressive disorder, recurrent severe without psychotic features: Secondary | ICD-10-CM | POA: Diagnosis not present

## 2019-08-08 DIAGNOSIS — F411 Generalized anxiety disorder: Secondary | ICD-10-CM | POA: Diagnosis not present

## 2019-08-12 DIAGNOSIS — F411 Generalized anxiety disorder: Secondary | ICD-10-CM | POA: Diagnosis not present

## 2019-08-12 DIAGNOSIS — F332 Major depressive disorder, recurrent severe without psychotic features: Secondary | ICD-10-CM | POA: Diagnosis not present

## 2019-08-15 DIAGNOSIS — F411 Generalized anxiety disorder: Secondary | ICD-10-CM | POA: Diagnosis not present

## 2019-08-15 DIAGNOSIS — F332 Major depressive disorder, recurrent severe without psychotic features: Secondary | ICD-10-CM | POA: Diagnosis not present

## 2019-08-19 DIAGNOSIS — F411 Generalized anxiety disorder: Secondary | ICD-10-CM | POA: Diagnosis not present

## 2019-08-19 DIAGNOSIS — F332 Major depressive disorder, recurrent severe without psychotic features: Secondary | ICD-10-CM | POA: Diagnosis not present

## 2019-08-22 DIAGNOSIS — F411 Generalized anxiety disorder: Secondary | ICD-10-CM | POA: Diagnosis not present

## 2019-08-22 DIAGNOSIS — F332 Major depressive disorder, recurrent severe without psychotic features: Secondary | ICD-10-CM | POA: Diagnosis not present

## 2019-08-29 DIAGNOSIS — F332 Major depressive disorder, recurrent severe without psychotic features: Secondary | ICD-10-CM | POA: Diagnosis not present

## 2019-08-29 DIAGNOSIS — F411 Generalized anxiety disorder: Secondary | ICD-10-CM | POA: Diagnosis not present

## 2019-09-05 DIAGNOSIS — F332 Major depressive disorder, recurrent severe without psychotic features: Secondary | ICD-10-CM | POA: Diagnosis not present

## 2019-09-05 DIAGNOSIS — F411 Generalized anxiety disorder: Secondary | ICD-10-CM | POA: Diagnosis not present

## 2019-09-23 DIAGNOSIS — H25013 Cortical age-related cataract, bilateral: Secondary | ICD-10-CM | POA: Diagnosis not present

## 2019-09-23 DIAGNOSIS — H40023 Open angle with borderline findings, high risk, bilateral: Secondary | ICD-10-CM | POA: Diagnosis not present

## 2019-09-23 DIAGNOSIS — H2513 Age-related nuclear cataract, bilateral: Secondary | ICD-10-CM | POA: Diagnosis not present

## 2019-09-23 DIAGNOSIS — F332 Major depressive disorder, recurrent severe without psychotic features: Secondary | ICD-10-CM | POA: Diagnosis not present

## 2019-09-23 DIAGNOSIS — F411 Generalized anxiety disorder: Secondary | ICD-10-CM | POA: Diagnosis not present

## 2019-09-26 DIAGNOSIS — F332 Major depressive disorder, recurrent severe without psychotic features: Secondary | ICD-10-CM | POA: Diagnosis not present

## 2019-09-26 DIAGNOSIS — F411 Generalized anxiety disorder: Secondary | ICD-10-CM | POA: Diagnosis not present

## 2019-09-30 DIAGNOSIS — F411 Generalized anxiety disorder: Secondary | ICD-10-CM | POA: Diagnosis not present

## 2019-09-30 DIAGNOSIS — F332 Major depressive disorder, recurrent severe without psychotic features: Secondary | ICD-10-CM | POA: Diagnosis not present

## 2019-10-07 DIAGNOSIS — F332 Major depressive disorder, recurrent severe without psychotic features: Secondary | ICD-10-CM | POA: Diagnosis not present

## 2019-10-07 DIAGNOSIS — F411 Generalized anxiety disorder: Secondary | ICD-10-CM | POA: Diagnosis not present

## 2019-10-22 DIAGNOSIS — M545 Low back pain: Secondary | ICD-10-CM | POA: Diagnosis not present

## 2019-10-22 DIAGNOSIS — M25562 Pain in left knee: Secondary | ICD-10-CM | POA: Diagnosis not present

## 2019-10-22 DIAGNOSIS — M79675 Pain in left toe(s): Secondary | ICD-10-CM | POA: Diagnosis not present

## 2019-10-22 DIAGNOSIS — M79644 Pain in right finger(s): Secondary | ICD-10-CM | POA: Diagnosis not present

## 2019-10-28 DIAGNOSIS — F411 Generalized anxiety disorder: Secondary | ICD-10-CM | POA: Diagnosis not present

## 2019-10-28 DIAGNOSIS — F332 Major depressive disorder, recurrent severe without psychotic features: Secondary | ICD-10-CM | POA: Diagnosis not present

## 2019-10-29 DIAGNOSIS — M25562 Pain in left knee: Secondary | ICD-10-CM | POA: Diagnosis not present

## 2019-10-31 DIAGNOSIS — F332 Major depressive disorder, recurrent severe without psychotic features: Secondary | ICD-10-CM | POA: Diagnosis not present

## 2019-10-31 DIAGNOSIS — F411 Generalized anxiety disorder: Secondary | ICD-10-CM | POA: Diagnosis not present

## 2019-11-06 DIAGNOSIS — M79675 Pain in left toe(s): Secondary | ICD-10-CM | POA: Diagnosis not present

## 2019-11-07 DIAGNOSIS — F332 Major depressive disorder, recurrent severe without psychotic features: Secondary | ICD-10-CM | POA: Diagnosis not present

## 2019-11-07 DIAGNOSIS — F411 Generalized anxiety disorder: Secondary | ICD-10-CM | POA: Diagnosis not present

## 2019-11-11 DIAGNOSIS — F332 Major depressive disorder, recurrent severe without psychotic features: Secondary | ICD-10-CM | POA: Diagnosis not present

## 2019-11-11 DIAGNOSIS — F411 Generalized anxiety disorder: Secondary | ICD-10-CM | POA: Diagnosis not present

## 2019-11-12 DIAGNOSIS — M79675 Pain in left toe(s): Secondary | ICD-10-CM | POA: Diagnosis not present

## 2019-11-12 DIAGNOSIS — M25562 Pain in left knee: Secondary | ICD-10-CM | POA: Diagnosis not present

## 2019-11-12 DIAGNOSIS — M545 Low back pain: Secondary | ICD-10-CM | POA: Diagnosis not present

## 2019-11-14 DIAGNOSIS — F411 Generalized anxiety disorder: Secondary | ICD-10-CM | POA: Diagnosis not present

## 2019-11-14 DIAGNOSIS — F332 Major depressive disorder, recurrent severe without psychotic features: Secondary | ICD-10-CM | POA: Diagnosis not present

## 2019-11-21 DIAGNOSIS — F332 Major depressive disorder, recurrent severe without psychotic features: Secondary | ICD-10-CM | POA: Diagnosis not present

## 2019-11-21 DIAGNOSIS — F411 Generalized anxiety disorder: Secondary | ICD-10-CM | POA: Diagnosis not present

## 2019-12-05 DIAGNOSIS — F411 Generalized anxiety disorder: Secondary | ICD-10-CM | POA: Diagnosis not present

## 2019-12-05 DIAGNOSIS — F332 Major depressive disorder, recurrent severe without psychotic features: Secondary | ICD-10-CM | POA: Diagnosis not present

## 2019-12-12 DIAGNOSIS — F332 Major depressive disorder, recurrent severe without psychotic features: Secondary | ICD-10-CM | POA: Diagnosis not present

## 2019-12-12 DIAGNOSIS — F411 Generalized anxiety disorder: Secondary | ICD-10-CM | POA: Diagnosis not present

## 2019-12-19 DIAGNOSIS — F411 Generalized anxiety disorder: Secondary | ICD-10-CM | POA: Diagnosis not present

## 2019-12-19 DIAGNOSIS — F332 Major depressive disorder, recurrent severe without psychotic features: Secondary | ICD-10-CM | POA: Diagnosis not present

## 2019-12-22 ENCOUNTER — Ambulatory Visit: Payer: BC Managed Care – PPO | Attending: Internal Medicine

## 2019-12-22 DIAGNOSIS — Z23 Encounter for immunization: Secondary | ICD-10-CM

## 2019-12-22 NOTE — Progress Notes (Signed)
   Covid-19 Vaccination Clinic  Name:  Taylor Chen    MRN: [id number] DOB: 1960/12/09  12/22/2019  Taylor Chen was observed post Covid-19 immunization for 15 minutes without incident. She was provided with Vaccine Information Sheet and instruction to access the V-Safe system.   Taylor Chen was instructed to call 911 with any severe reactions post vaccine: Marland Kitchen Difficulty breathing  . Swelling of face and throat  . A fast heartbeat  . A bad rash all over body  . Dizziness and weakness   Immunizations Administered    Name Date Dose VIS Date Route   Pfizer COVID-19 Vaccine 12/22/2019  9:15 AM 0.3 mL 09/27/2019 Intramuscular   Manufacturer: [employer]   Lot: [id number]   [place]: [id number]

## 2020-01-02 DIAGNOSIS — F411 Generalized anxiety disorder: Secondary | ICD-10-CM | POA: Diagnosis not present

## 2020-01-02 DIAGNOSIS — F332 Major depressive disorder, recurrent severe without psychotic features: Secondary | ICD-10-CM | POA: Diagnosis not present

## 2020-01-16 DIAGNOSIS — F332 Major depressive disorder, recurrent severe without psychotic features: Secondary | ICD-10-CM | POA: Diagnosis not present

## 2020-01-16 DIAGNOSIS — F411 Generalized anxiety disorder: Secondary | ICD-10-CM | POA: Diagnosis not present

## 2020-01-21 ENCOUNTER — Ambulatory Visit: Payer: BC Managed Care – PPO | Attending: Internal Medicine

## 2020-01-21 DIAGNOSIS — Z23 Encounter for immunization: Secondary | ICD-10-CM

## 2020-01-21 NOTE — Progress Notes (Signed)
   Covid-19 Vaccination Clinic  Name:  Taylor Chen    MRN: [id number] DOB: May 28, 1961  01/21/2020  [name] was observed post Covid-19 immunization for 15 minutes without incident. She was provided with Vaccine Information Sheet and instruction to access the V-Safe system.   [name] was instructed to call 911 with any severe reactions post vaccine: Marland Kitchen Difficulty breathing  . Swelling of face and throat  . A fast heartbeat  . A bad rash all over body  . Dizziness and weakness   Immunizations Administered    Name Date Dose VIS Date Route   Pfizer COVID-19 Vaccine 01/21/2020 11:05 AM 0.3 mL 09/27/2019 Intramuscular   Manufacturer: [employer], [employer]   Lot: [id number]   [place]: [id number]

## 2020-02-03 DIAGNOSIS — F411 Generalized anxiety disorder: Secondary | ICD-10-CM | POA: Diagnosis not present

## 2020-02-03 DIAGNOSIS — F332 Major depressive disorder, recurrent severe without psychotic features: Secondary | ICD-10-CM | POA: Diagnosis not present

## 2020-02-10 DIAGNOSIS — F411 Generalized anxiety disorder: Secondary | ICD-10-CM | POA: Diagnosis not present

## 2020-02-10 DIAGNOSIS — F332 Major depressive disorder, recurrent severe without psychotic features: Secondary | ICD-10-CM | POA: Diagnosis not present

## 2020-02-13 DIAGNOSIS — F332 Major depressive disorder, recurrent severe without psychotic features: Secondary | ICD-10-CM | POA: Diagnosis not present

## 2020-02-13 DIAGNOSIS — F411 Generalized anxiety disorder: Secondary | ICD-10-CM | POA: Diagnosis not present

## 2020-02-20 DIAGNOSIS — F411 Generalized anxiety disorder: Secondary | ICD-10-CM | POA: Diagnosis not present

## 2020-02-20 DIAGNOSIS — F332 Major depressive disorder, recurrent severe without psychotic features: Secondary | ICD-10-CM | POA: Diagnosis not present

## 2020-02-24 DIAGNOSIS — F411 Generalized anxiety disorder: Secondary | ICD-10-CM | POA: Diagnosis not present

## 2020-02-24 DIAGNOSIS — F332 Major depressive disorder, recurrent severe without psychotic features: Secondary | ICD-10-CM | POA: Diagnosis not present

## 2020-02-27 DIAGNOSIS — M545 Low back pain: Secondary | ICD-10-CM | POA: Diagnosis not present

## 2020-02-27 DIAGNOSIS — F411 Generalized anxiety disorder: Secondary | ICD-10-CM | POA: Diagnosis not present

## 2020-02-27 DIAGNOSIS — F332 Major depressive disorder, recurrent severe without psychotic features: Secondary | ICD-10-CM | POA: Diagnosis not present

## 2020-03-09 DIAGNOSIS — F332 Major depressive disorder, recurrent severe without psychotic features: Secondary | ICD-10-CM | POA: Diagnosis not present

## 2020-03-09 DIAGNOSIS — F411 Generalized anxiety disorder: Secondary | ICD-10-CM | POA: Diagnosis not present

## 2020-03-12 DIAGNOSIS — E7800 Pure hypercholesterolemia, unspecified: Secondary | ICD-10-CM | POA: Diagnosis not present

## 2020-03-12 DIAGNOSIS — M545 Low back pain: Secondary | ICD-10-CM | POA: Diagnosis not present

## 2020-03-12 DIAGNOSIS — I10 Essential (primary) hypertension: Secondary | ICD-10-CM | POA: Diagnosis not present

## 2020-03-12 DIAGNOSIS — F332 Major depressive disorder, recurrent severe without psychotic features: Secondary | ICD-10-CM | POA: Diagnosis not present

## 2020-03-12 DIAGNOSIS — F411 Generalized anxiety disorder: Secondary | ICD-10-CM | POA: Diagnosis not present

## 2020-03-17 DIAGNOSIS — E7800 Pure hypercholesterolemia, unspecified: Secondary | ICD-10-CM | POA: Diagnosis not present

## 2020-03-19 DIAGNOSIS — F332 Major depressive disorder, recurrent severe without psychotic features: Secondary | ICD-10-CM | POA: Diagnosis not present

## 2020-03-19 DIAGNOSIS — F411 Generalized anxiety disorder: Secondary | ICD-10-CM | POA: Diagnosis not present

## 2020-03-23 DIAGNOSIS — H40023 Open angle with borderline findings, high risk, bilateral: Secondary | ICD-10-CM | POA: Diagnosis not present

## 2020-03-26 DIAGNOSIS — F332 Major depressive disorder, recurrent severe without psychotic features: Secondary | ICD-10-CM | POA: Diagnosis not present

## 2020-03-26 DIAGNOSIS — F411 Generalized anxiety disorder: Secondary | ICD-10-CM | POA: Diagnosis not present

## 2020-05-07 DIAGNOSIS — F411 Generalized anxiety disorder: Secondary | ICD-10-CM | POA: Diagnosis not present

## 2020-05-07 DIAGNOSIS — F332 Major depressive disorder, recurrent severe without psychotic features: Secondary | ICD-10-CM | POA: Diagnosis not present

## 2020-05-21 DIAGNOSIS — F332 Major depressive disorder, recurrent severe without psychotic features: Secondary | ICD-10-CM | POA: Diagnosis not present

## 2020-05-21 DIAGNOSIS — F411 Generalized anxiety disorder: Secondary | ICD-10-CM | POA: Diagnosis not present

## 2020-05-25 DIAGNOSIS — F332 Major depressive disorder, recurrent severe without psychotic features: Secondary | ICD-10-CM | POA: Diagnosis not present

## 2020-05-25 DIAGNOSIS — F411 Generalized anxiety disorder: Secondary | ICD-10-CM | POA: Diagnosis not present

## 2020-06-01 DIAGNOSIS — F332 Major depressive disorder, recurrent severe without psychotic features: Secondary | ICD-10-CM | POA: Diagnosis not present

## 2020-06-01 DIAGNOSIS — F411 Generalized anxiety disorder: Secondary | ICD-10-CM | POA: Diagnosis not present

## 2020-06-04 DIAGNOSIS — F332 Major depressive disorder, recurrent severe without psychotic features: Secondary | ICD-10-CM | POA: Diagnosis not present

## 2020-06-04 DIAGNOSIS — F411 Generalized anxiety disorder: Secondary | ICD-10-CM | POA: Diagnosis not present

## 2020-06-08 DIAGNOSIS — F332 Major depressive disorder, recurrent severe without psychotic features: Secondary | ICD-10-CM | POA: Diagnosis not present

## 2020-06-08 DIAGNOSIS — F411 Generalized anxiety disorder: Secondary | ICD-10-CM | POA: Diagnosis not present

## 2020-06-11 DIAGNOSIS — F411 Generalized anxiety disorder: Secondary | ICD-10-CM | POA: Diagnosis not present

## 2020-06-11 DIAGNOSIS — F332 Major depressive disorder, recurrent severe without psychotic features: Secondary | ICD-10-CM | POA: Diagnosis not present

## 2020-06-15 DIAGNOSIS — F411 Generalized anxiety disorder: Secondary | ICD-10-CM | POA: Diagnosis not present

## 2020-06-15 DIAGNOSIS — F332 Major depressive disorder, recurrent severe without psychotic features: Secondary | ICD-10-CM | POA: Diagnosis not present

## 2020-06-18 DIAGNOSIS — F411 Generalized anxiety disorder: Secondary | ICD-10-CM | POA: Diagnosis not present

## 2020-06-18 DIAGNOSIS — F332 Major depressive disorder, recurrent severe without psychotic features: Secondary | ICD-10-CM | POA: Diagnosis not present

## 2020-06-29 DIAGNOSIS — F332 Major depressive disorder, recurrent severe without psychotic features: Secondary | ICD-10-CM | POA: Diagnosis not present

## 2020-06-29 DIAGNOSIS — F411 Generalized anxiety disorder: Secondary | ICD-10-CM | POA: Diagnosis not present

## 2020-07-02 DIAGNOSIS — M545 Low back pain: Secondary | ICD-10-CM | POA: Diagnosis not present

## 2020-07-02 DIAGNOSIS — E7800 Pure hypercholesterolemia, unspecified: Secondary | ICD-10-CM | POA: Diagnosis not present

## 2020-07-02 DIAGNOSIS — I10 Essential (primary) hypertension: Secondary | ICD-10-CM | POA: Diagnosis not present

## 2020-07-08 ENCOUNTER — Encounter: Payer: Self-pay | Admitting: Physical Therapy

## 2020-07-08 ENCOUNTER — Other Ambulatory Visit: Payer: Self-pay

## 2020-07-08 ENCOUNTER — Ambulatory Visit: Payer: BC Managed Care – PPO | Attending: Family Medicine | Admitting: Physical Therapy

## 2020-07-08 DIAGNOSIS — M546 Pain in thoracic spine: Secondary | ICD-10-CM | POA: Insufficient documentation

## 2020-07-08 DIAGNOSIS — G8929 Other chronic pain: Secondary | ICD-10-CM

## 2020-07-08 DIAGNOSIS — M545 Low back pain, unspecified: Secondary | ICD-10-CM

## 2020-07-08 DIAGNOSIS — R252 Cramp and spasm: Secondary | ICD-10-CM | POA: Insufficient documentation

## 2020-07-08 DIAGNOSIS — M6281 Muscle weakness (generalized): Secondary | ICD-10-CM | POA: Insufficient documentation

## 2020-07-08 NOTE — Patient Instructions (Signed)
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
## 2020-07-13 DIAGNOSIS — F332 Major depressive disorder, recurrent severe without psychotic features: Secondary | ICD-10-CM | POA: Diagnosis not present

## 2020-07-13 DIAGNOSIS — F411 Generalized anxiety disorder: Secondary | ICD-10-CM | POA: Diagnosis not present

## 2020-07-15 ENCOUNTER — Encounter: Payer: Self-pay | Admitting: Physical Therapy

## 2020-07-15 ENCOUNTER — Ambulatory Visit: Payer: BC Managed Care – PPO | Admitting: Physical Therapy

## 2020-07-15 ENCOUNTER — Other Ambulatory Visit: Payer: Self-pay

## 2020-07-15 DIAGNOSIS — G8929 Other chronic pain: Secondary | ICD-10-CM | POA: Diagnosis not present

## 2020-07-15 DIAGNOSIS — M545 Low back pain, unspecified: Secondary | ICD-10-CM

## 2020-07-15 DIAGNOSIS — R252 Cramp and spasm: Secondary | ICD-10-CM

## 2020-07-15 DIAGNOSIS — M6281 Muscle weakness (generalized): Secondary | ICD-10-CM

## 2020-07-15 DIAGNOSIS — M546 Pain in thoracic spine: Secondary | ICD-10-CM | POA: Diagnosis not present

## 2020-07-15 NOTE — Patient Instructions (Signed)
Access Code: [email or web address] URL: [email or web address] Date: 07/15/2020 Prepared by: [name] Sonyia Muro  Exercises Hooklying Single Knee to Chest Stretch - 2 x daily - 7 x weekly - 3 sets - 5 reps - 10 hold Supine Posterior Pelvic Tilt - 2 x daily - 7 x weekly - 2 sets - 10 reps Supine Piriformis Stretch with Foot on Ground - 2 x daily - 7 x weekly - 1 sets - 2 reps - 30 hold Supine Figure 4 Piriformis Stretch - 2 x daily - 7 x weekly - 1 sets - 2 reps - 30 hold Child's Pose Stretch - 1 x daily - 7 x weekly - 1 sets - 3 reps - 10 hold Cat-Camel - 1 x daily - 7 x weekly - 1 sets - 10 reps Dead Bug - 1 x daily - 7 x weekly - 2 sets - 10 reps Hooklying Isometric Hip Flexion - 1 x daily - 7 x weekly - 1 sets - 5 reps - 5 hold Bridge - 1 x daily - 7 x weekly - 2 sets - 10 reps

## 2020-07-15 NOTE — Therapy (Signed)
[facility] [address] [place], [place], [address] Phone: [phone]   Fax:  [phone]  Physical Therapy Treatment  Patient Details  Name: Taylor Chen MRN: [id number] Date of Birth: [birth year] Referring Provider (PT): [name], MD   Encounter Date: 07/15/2020   PT End of Session - 07/15/20 0934    Visit Number 2    Date for PT Re-Evaluation 09/30/20    Authorization Type BCBS    PT Start Time 0934    PT Stop Time 1015    PT Time Calculation (min) 41 min    Activity Tolerance Patient tolerated treatment well    Behavior During Therapy [facility] for tasks assessed/performed           Past Medical History:  Diagnosis Date  . Anemia    IN HER 20'S  . Anginal pain (HCC)    LAST COUPLE DAYS  . Anxiety   . Asthma    CHILDHOOD  . Depression   . Headache    MIGRAINES  . Heart murmur    HAS BEEN TOLD  . History of stress test 2012  . Hypertension    CONTROLLED BY DIET    Past Surgical History:  Procedure Laterality Date  . DILATATION & CURETTAGE/HYSTEROSCOPY WITH MYOSURE N/A 08/25/2016   Procedure: DILATATION & CURETTAGE/HYSTEROSCOPY WITH MYOSURE;  Surgeon: Linda Hedges, DO;  Location: [facility];  Service: Gynecology;  Laterality: N/A;  . HYSTOSCOPY  2010  . INCISION AND DRAINAGE  1999    There were no vitals filed for this visit.   Subjective Assessment - 07/15/20 0939    Subjective I had high pain levels after eval but the stretches have helped.    Pertinent History fell on coccyx yesterday down the stairs    Limitations Standing    How long can you stand comfortably? 10-15 min    How long can you walk comfortably? walks 1-2.5 hours/day but walks through the pain    Diagnostic tests xrays - negative    Patient Stated Goals get rid of pain    Currently in Pain? Yes    Pain Score 4     Pain Location Back    Pain Orientation Mid;Lower;Left;Right    Pain Descriptors / Indicators  Sharp;Aching;Sore    Pain Type Chronic pain    Pain Onset More than a month ago    Pain Frequency Intermittent    Aggravating Factors  bend/stand    Pain Relieving Factors heat/meds                             [facility] Adult PT Treatment/Exercise - 07/15/20 0001      Neuro Re-ed    Neuro Re-ed Details  seated and standing trunk alignment with 1 rep of sit to stand      Exercises   Exercises Lumbar;Knee/Hip      Lumbar Exercises: Stretches   Single Knee to Chest Stretch Left;Right;2 reps;10 seconds    Piriformis Stretch Left;Right;1 rep;20 seconds    Piriformis Stretch Limitations supine    Figure 4 Stretch 1 rep;With overpressure;Supine    Figure 4 Stretch Limitations supine      Lumbar Exercises: Aerobic   Recumbent Bike L1 x 4', PT present to review pain and initiation HEP      Lumbar Exercises: Supine   Pelvic Tilt 10 reps    Dead Bug Limitations arms only with VCs to knit ribcage  anteriorly, then added contralateral march    Bridge 10 reps    Bridge Limitations reviewed articulating and non-artic, able to do 10 non-artic w/o pain    Isometric Hip Flexion 5 reps;5 seconds    Isometric Hip Flexion Limitations 2 reps, VCs to control pelvic drop and to knit ribcage      Lumbar Exercises: Quadruped   Madcat/Old Horse 5 reps    Other Quadruped Lumbar Exercises prayer and prayer with SB 2x10 each                    PT Short Term Goals - 07/15/20 1259      PT SHORT TERM GOAL #1   Title ind with initial HEP    Status On-going      PT SHORT TERM GOAL #3   Title Pt will be able to perform sit to stand with proper body mechanics, core engagement without bracing hands on knees with min pain x 5 reps    Status On-going      PT SHORT TERM GOAL #4   Title Improved bil hip flexibility to [facility] to reduce undue strain on spine.    Status On-going             PT Long Term Goals - 07/08/20 1350      PT LONG TERM GOAL #1   Title Pt will be ind with  advanced HEP    Time 12    Period Weeks    Status New    Target Date 09/30/20      PT LONG TERM GOAL #2   Title Pt will be able to perform all tranitional movements for bed mobility, car and chair transfers with min pain.    Time 12    Period Weeks    Status New    Target Date 09/30/20      PT LONG TERM GOAL #3   Title Pt will be able to tolerate standing tasks for cooking/cleaning x 20 min with min pain.    Time 12    Period Weeks    Status New    Target Date 09/30/20      PT LONG TERM GOAL #4   Title FOTO reduced to <= 52% to demo less limitation    Baseline 76%    Time 12    Period Weeks    Status New    Target Date 09/30/20      PT LONG TERM GOAL #5   Title Pt will demo proper body mechanics and good stabilization of spine for bending/lifting to reduce chance of re-injury.    Time 12    Period Weeks    Status New    Target Date 09/30/20                 Plan - 07/15/20 1255    Clinical Impression Statement Pt arrived with lower level of pain from eval with report of improvement with the stretches in HEP.  PT advanced spine ROM and stretching and introduced supine core stabilization.  Pt with good tolerance but found hip flexion isom quite challenging, limiting her to 2 reps each side before requesting to stop.  She is able to perform bridges and dead bugs without pain and with good response to VCs for knitted ribcage and level pelvis.  PT introduced Pt to proper seated and standing trunk alignment to avoid lumbar hinge.  No exacerbation of pain with ther ex today.  Continue along POC.  Rehab Potential Excellent    PT Frequency 2x / week    PT Duration 12 weeks    PT Treatment/Interventions ADLs/Self Care Home Management;Moist Heat;Cryotherapy;Electrical Stimulation;Neuromuscular re-education;Balance training;Therapeutic exercise;Therapeutic activities;Functional mobility training;Patient/family education;Manual techniques;Dry needling;Passive range of  motion;Spinal Manipulations;Joint Manipulations    PT Next Visit Plan recumbant bike, review HEP, STM bil paraspinals lumbar and t-spine, DN lumbar multifidi as needed, review standing alignment and add sit to stand for body mechanics and tband pullbacks    PT Home Exercise Plan Access Code: [email or web address] + DN info    Consulted and Agree with Plan of Care Patient           Patient will benefit from skilled therapeutic intervention in order to improve the following deficits and impairments:     Visit Diagnosis: Chronic bilateral low back pain without sciatica  Pain in thoracic spine  Muscle weakness (generalized)  Cramp and spasm     Problem List There are no problems to display for this patient.   Taylor Chen, PT 07/15/20 1:00 PM    [facility] [address], [place], [place], [address] Phone: [phone]   Fax:  [phone]  Name: Taylor Chen MRN: [id number] Date of Birth: 12/08/1960

## 2020-07-22 ENCOUNTER — Other Ambulatory Visit: Payer: Self-pay

## 2020-07-22 ENCOUNTER — Encounter: Payer: Self-pay | Admitting: Physical Therapy

## 2020-07-22 ENCOUNTER — Ambulatory Visit: Payer: BC Managed Care – PPO | Attending: Family Medicine | Admitting: Physical Therapy

## 2020-07-22 DIAGNOSIS — R252 Cramp and spasm: Secondary | ICD-10-CM | POA: Diagnosis not present

## 2020-07-22 DIAGNOSIS — M6281 Muscle weakness (generalized): Secondary | ICD-10-CM

## 2020-07-22 DIAGNOSIS — M546 Pain in thoracic spine: Secondary | ICD-10-CM | POA: Diagnosis not present

## 2020-07-22 DIAGNOSIS — M545 Low back pain, unspecified: Secondary | ICD-10-CM | POA: Insufficient documentation

## 2020-07-22 DIAGNOSIS — G8929 Other chronic pain: Secondary | ICD-10-CM | POA: Insufficient documentation

## 2020-07-22 NOTE — Therapy (Signed)
[facility] [address], [place], [place], [address] Phone: [phone]   Fax:  [phone]  Physical Therapy Treatment  Patient Details  Name: Taylor Chen MRN: [id number] Date of Birth: 1961/06/25 Referring Provider (PT): [name], MD   Encounter Date: 07/22/2020   PT End of Session - 07/22/20 0928    Visit Number 3    Date for PT Re-Evaluation 09/30/20    Authorization Type BCBS    PT Start Time 0930    PT Stop Time 1941    PT Time Calculation (min) 45 min    Activity Tolerance Patient tolerated treatment well    Behavior During Therapy [facility] for tasks assessed/performed           Past Medical History:  Diagnosis Date   Anemia    IN HER 20'S   Anginal pain ([place])    LAST COUPLE DAYS   Anxiety    Asthma    CHILDHOOD   Depression    Headache    MIGRAINES   Heart murmur    HAS BEEN TOLD   History of stress test 2012   Hypertension    CONTROLLED BY DIET    Past Surgical History:  Procedure Laterality Date   DILATATION & CURETTAGE/HYSTEROSCOPY WITH MYOSURE N/A 08/25/2016   Procedure: [place];  Surgeon: Linda Hedges, DO;  Location: [facility];  Service: Gynecology;  Laterality: N/A;   HYSTOSCOPY  2010   INCISION AND DRAINAGE  1999    There were no vitals filed for this visit.   Subjective Assessment - 07/22/20 0928    Subjective I don't like the hip flexor isometric exercise - it hurts or it's just hard.  I walked x 1 hour this morning which increases my pain.    Pertinent History fell on coccyx yesterday down the stairs    Limitations Standing    How long can you stand comfortably? 10-15 min    How long can you walk comfortably? walks 1-2.5 hours/day but walks through the pain    Diagnostic tests xrays - negative    Patient Stated Goals get rid of pain    Currently in Pain? Yes    Pain Score 8     Pain Location Back    Pain Orientation  Lower;Right;Left    Pain Type Chronic pain    Pain Onset More than a month ago    Pain Frequency Intermittent    Aggravating Factors  bend/stand                             [facility] Adult PT Treatment/Exercise - 07/22/20 0001      Lumbar Exercises: Stretches   Pelvic Tilt 10 reps    Quadruped Mid Back Stretch 3 reps;10 seconds    Piriformis Stretch Left;Right;20 seconds;1 rep    Piriformis Stretch Limitations supine    Other Lumbar Stretch Exercise warrior with bil UE overhead reach/SB 2x10 each    Other Lumbar Stretch Exercise prayer and prayer with SB 1x10 each      Lumbar Exercises: Aerobic   Nustep L4 x 5', Pt likes recumbent bike better, PT present to discuss HEP/progress      Lumbar Exercises: Seated   Sit to Stand 10 reps    Sit to Stand Limitations from black pad then from low mat table, PT cued and demo'd hip hinge, Pt able to complete without pain or bracing  Lumbar Exercises: Supine   Pelvic Tilt 10 reps    Bridge 10 reps      Manual Therapy   Manual Therapy Soft tissue mobilization    Soft tissue mobilization bil gluts, piriformis, lumbar paraspinals and mulifiidus                    PT Short Term Goals - 07/15/20 1259      PT SHORT TERM GOAL #1   Title ind with initial HEP    Status On-going      PT SHORT TERM GOAL #3   Title Pt will be able to perform sit to stand with proper body mechanics, core engagement without bracing hands on knees with min pain x 5 reps    Status On-going      PT SHORT TERM GOAL #4   Title Improved bil hip flexibility to [facility] to reduce undue strain on spine.    Status On-going             PT Long Term Goals - 07/08/20 1350      PT LONG TERM GOAL #1   Title Pt will be ind with advanced HEP    Time 12    Period Weeks    Status New    Target Date 09/30/20      PT LONG TERM GOAL #2   Title Pt will be able to perform all tranitional movements for bed mobility, car and chair transfers with min  pain.    Time 12    Period Weeks    Status New    Target Date 09/30/20      PT LONG TERM GOAL #3   Title Pt will be able to tolerate standing tasks for cooking/cleaning x 20 min with min pain.    Time 12    Period Weeks    Status New    Target Date 09/30/20      PT LONG TERM GOAL #4   Title FOTO reduced to <= 52% to demo less limitation    Baseline 76%    Time 12    Period Weeks    Status New    Target Date 09/30/20      PT LONG TERM GOAL #5   Title Pt will demo proper body mechanics and good stabilization of spine for bending/lifting to reduce chance of re-injury.    Time 12    Period Weeks    Status New    Target Date 09/30/20                 Plan - 07/22/20 1251    Clinical Impression Statement Pt reported 8/10 pain on arrival today.  She walked x 1 hour this morning despite pain. She reports the HEP stretches do help her pain.  PT reviewed hip hinge for sit to stands and Pt was able to demo without need for manual bracing and without pain.  PT added warrior stretch to reduce pull from hip flexors on lumbar spine.  PT performed STM to lumbar region and hips to address soft tissue tension Lt>Rt bil which Pt liked and felt taller afterwards.  She "usually doesn't like massages" but this was ok.  Pt will continue to benefit from skilled PT along POC.    PT Frequency 2x / week    PT Duration 12 weeks    PT Treatment/Interventions ADLs/Self Care Home Management;Moist Heat;Cryotherapy;Electrical Stimulation;Neuromuscular re-education;Balance training;Therapeutic exercise;Therapeutic activities;Functional mobility training;Patient/family education;Manual techniques;Dry needling;Passive range of motion;Spinal Manipulations;Joint  Manipulations    PT Next Visit Plan bike, warrior w/ SB, spine stretches, add clam and reverse clam, functional squat, tband UE ext and row, DN lumbar and Lt hip (LEFT>RIGHT need)    PT Home Exercise Plan Access Code: [email or web address] + DN info    Consulted and  Agree with Plan of Care Patient           Patient will benefit from skilled therapeutic intervention in order to improve the following deficits and impairments:     Visit Diagnosis: Chronic bilateral low back pain without sciatica  Pain in thoracic spine  Muscle weakness (generalized)  Cramp and spasm     Problem List There are no problems to display for this patient.   [name] Adil Tugwell, PT 07/22/20 12:56 PM   [facility] [address] [place], [place], [address] Phone: [phone]   Fax:  [phone]  Name: Taylor Chen MRN: [id number] Date of Birth: March 28, 1961

## 2020-07-29 ENCOUNTER — Ambulatory Visit: Payer: BC Managed Care – PPO | Admitting: Physical Therapy

## 2020-07-30 DIAGNOSIS — F411 Generalized anxiety disorder: Secondary | ICD-10-CM | POA: Diagnosis not present

## 2020-07-30 DIAGNOSIS — F332 Major depressive disorder, recurrent severe without psychotic features: Secondary | ICD-10-CM | POA: Diagnosis not present

## 2020-08-03 DIAGNOSIS — F411 Generalized anxiety disorder: Secondary | ICD-10-CM | POA: Diagnosis not present

## 2020-08-03 DIAGNOSIS — F332 Major depressive disorder, recurrent severe without psychotic features: Secondary | ICD-10-CM | POA: Diagnosis not present

## 2020-08-07 DIAGNOSIS — F332 Major depressive disorder, recurrent severe without psychotic features: Secondary | ICD-10-CM | POA: Diagnosis not present

## 2020-08-07 DIAGNOSIS — F411 Generalized anxiety disorder: Secondary | ICD-10-CM | POA: Diagnosis not present

## 2020-08-11 DIAGNOSIS — F439 Reaction to severe stress, unspecified: Secondary | ICD-10-CM | POA: Diagnosis not present

## 2020-08-11 DIAGNOSIS — R000 Tachycardia, unspecified: Secondary | ICD-10-CM | POA: Diagnosis not present

## 2020-08-11 DIAGNOSIS — F419 Anxiety disorder, unspecified: Secondary | ICD-10-CM | POA: Diagnosis not present

## 2020-08-14 ENCOUNTER — Encounter: Payer: BC Managed Care – PPO | Admitting: Physical Therapy

## 2020-08-14 DIAGNOSIS — F332 Major depressive disorder, recurrent severe without psychotic features: Secondary | ICD-10-CM | POA: Diagnosis not present

## 2020-08-14 DIAGNOSIS — F411 Generalized anxiety disorder: Secondary | ICD-10-CM | POA: Diagnosis not present

## 2020-08-17 ENCOUNTER — Ambulatory Visit: Payer: BC Managed Care – PPO | Attending: Family Medicine | Admitting: Physical Therapy

## 2020-08-17 DIAGNOSIS — R252 Cramp and spasm: Secondary | ICD-10-CM | POA: Insufficient documentation

## 2020-08-17 DIAGNOSIS — M546 Pain in thoracic spine: Secondary | ICD-10-CM | POA: Insufficient documentation

## 2020-08-17 DIAGNOSIS — M545 Low back pain, unspecified: Secondary | ICD-10-CM | POA: Insufficient documentation

## 2020-08-17 DIAGNOSIS — G8929 Other chronic pain: Secondary | ICD-10-CM | POA: Insufficient documentation

## 2020-08-17 DIAGNOSIS — M6281 Muscle weakness (generalized): Secondary | ICD-10-CM | POA: Insufficient documentation

## 2020-08-19 ENCOUNTER — Ambulatory Visit: Payer: BC Managed Care – PPO | Admitting: Physical Therapy

## 2020-08-19 ENCOUNTER — Encounter: Payer: Self-pay | Admitting: Physical Therapy

## 2020-08-19 ENCOUNTER — Other Ambulatory Visit: Payer: Self-pay

## 2020-08-19 DIAGNOSIS — M546 Pain in thoracic spine: Secondary | ICD-10-CM

## 2020-08-19 DIAGNOSIS — G8929 Other chronic pain: Secondary | ICD-10-CM

## 2020-08-19 DIAGNOSIS — R252 Cramp and spasm: Secondary | ICD-10-CM

## 2020-08-19 DIAGNOSIS — M6281 Muscle weakness (generalized): Secondary | ICD-10-CM | POA: Diagnosis not present

## 2020-08-19 DIAGNOSIS — M545 Low back pain, unspecified: Secondary | ICD-10-CM | POA: Diagnosis not present

## 2020-08-19 NOTE — Therapy (Signed)
[facility] [address], [place], [place], [address] Phone: [phone]   Fax:  [phone]  Physical Therapy Treatment  Patient Details  Name: Taylor Chen MRN: [id number] Date of Birth: [birth year] Referring Provider (PT): [name], MD   Encounter Date: 08/19/2020   PT End of Session - 08/19/20 0938    Visit Number 4    Date for PT Re-Evaluation 09/30/20    Authorization Type BCBS    PT Start Time 0940   Pt late   PT Stop Time 1018    PT Time Calculation (min) 38 min    Activity Tolerance Patient tolerated treatment well    Behavior During Therapy [facility] for tasks assessed/performed           Past Medical History:  Diagnosis Date  . Anemia    IN HER 20'S  . Anginal pain ([facility])    LAST COUPLE DAYS  . Anxiety   . Asthma    CHILDHOOD  . Depression   . Headache    MIGRAINES  . Heart murmur    HAS BEEN TOLD  . History of stress test 2012  . Hypertension    CONTROLLED BY DIET    Past Surgical History:  Procedure Laterality Date  . DILATATION & CURETTAGE/HYSTEROSCOPY WITH MYOSURE N/A 08/25/2016   Procedure: DILATATION & CURETTAGE/HYSTEROSCOPY WITH MYOSURE;  Surgeon: Linda Hedges, DO;  Location: [facility];  Service: Gynecology;  Laterality: N/A;  . HYSTOSCOPY  2010  . INCISION AND DRAINAGE  1999    There were no vitals filed for this visit.   Subjective Assessment - 08/19/20 0939    Subjective I am very high stress with caring for my daughter.  The stretches help me get through the pain during the day.  8/10 pain today.    Pertinent History fell on coccyx yesterday down the stairs    Limitations Standing    How long can you stand comfortably? 10-15 min    How long can you walk comfortably? walks 1-2.5 hours/day but walks through the pain    Diagnostic tests xrays - negative    Patient Stated Goals get rid of pain    Currently in Pain? Yes    Pain Score 8     Pain Location Back    Pain  Orientation Lower;Right;Left    Pain Type Chronic pain    Pain Onset More than a month ago    Pain Frequency Intermittent    Aggravating Factors  bend/stand    Pain Relieving Factors heat/meds                             [facility] Adult PT Treatment/Exercise - 08/19/20 0001      Self-Care   Self-Care Other Self-Care Comments    Other Self-Care Comments  Pain Neuroscience Education, stress/pain relationship, stress management tools (handout given), Vagus nerve stim activities      Exercises   Exercises Lumbar;Knee/Hip;Shoulder      Lumbar Exercises: Stretches   Single Knee to Chest Stretch 1 rep;30 seconds    Single Knee to Chest Stretch Limitations bil    Pelvic Tilt 10 reps      Lumbar Exercises: Aerobic   Recumbent Bike L2 x 6' heat lumbar and PT present to discuss plan for today      Lumbar Exercises: Seated   Other Seated Lumbar Exercises large ball rollouts for trunk flexion and flex/SB combo 3x10  each      Lumbar Exercises: Supine   Bridge 5 reps;Compliant    Bridge Limitations slow for segmental control    Other Supine Lumbar Exercises red ball UE mini crunch roll ups in posterior pelvic tilt    Other Supine Lumbar Exercises bil hip IR/ER x 10 reps      Lumbar Exercises: Sidelying   Other Sidelying Lumbar Exercises TA 5x5 sec holds with hip stacking bil                  PT Education - 08/19/20 1005    Education Details stress management tools    Person(s) Educated Patient    Methods Explanation;Handout    Comprehension Verbalized understanding            PT Short Term Goals - 07/15/20 1259      PT SHORT TERM GOAL #1   Title ind with initial HEP    Status On-going      PT SHORT TERM GOAL #3   Title Pt will be able to perform sit to stand with proper body mechanics, core engagement without bracing hands on knees with min pain x 5 reps    Status On-going      PT SHORT TERM GOAL #4   Title Improved bil hip flexibility to [facility] to  reduce undue strain on spine.    Status On-going             PT Long Term Goals - 07/08/20 1350      PT LONG TERM GOAL #1   Title Pt will be ind with advanced HEP    Time 12    Period Weeks    Status New    Target Date 09/30/20      PT LONG TERM GOAL #2   Title Pt will be able to perform all tranitional movements for bed mobility, car and chair transfers with min pain.    Time 12    Period Weeks    Status New    Target Date 09/30/20      PT LONG TERM GOAL #3   Title Pt will be able to tolerate standing tasks for cooking/cleaning x 20 min with min pain.    Time 12    Period Weeks    Status New    Target Date 09/30/20      PT LONG TERM GOAL #4   Title FOTO reduced to <= 52% to demo less limitation    Baseline 76%    Time 12    Period Weeks    Status New    Target Date 09/30/20      PT LONG TERM GOAL #5   Title Pt will demo proper body mechanics and good stabilization of spine for bending/lifting to reduce chance of re-injury.    Time 12    Period Weeks    Status New    Target Date 09/30/20                 Plan - 08/19/20 1302    Clinical Impression Statement Pt continues to have high pain levels of 8/10.  She admits high stress related to caring for her daughter with special needs.  She finds that the HEP stretches and heat help her manage her pain to "get through the day."  Session spent doing gentle spine mobility, stabilization and stretching with use of heat throughout session.  PT spent time discussing PNE and gave handout for self-care options for stress management.  Pt will benefit from manual therapy focus next visit to address tension in soft tissues of back and hips.    PT Frequency 2x / week    PT Duration 12 weeks    PT Treatment/Interventions ADLs/Self Care Home Management;Moist Heat;Cryotherapy;Electrical Stimulation;Neuromuscular re-education;Balance training;Therapeutic exercise;Therapeutic activities;Functional mobility training;Patient/family  education;Manual techniques;Dry needling;Passive range of motion;Spinal Manipulations;Joint Manipulations    PT Next Visit Plan bike, stretch, manual therapy (STM lumbar, thoracic, ribcage release, breathwork), heat/STIM    PT Home Exercise Plan Access Code: [email or web address] + DN info    Consulted and Agree with Plan of Care Patient           Patient will benefit from skilled therapeutic intervention in order to improve the following deficits and impairments:     Visit Diagnosis: Chronic bilateral low back pain without sciatica  Pain in thoracic spine  Muscle weakness (generalized)  Cramp and spasm     Problem List There are no problems to display for this patient.   [name] Marlee Trentman, PT 08/19/20 1:04 PM   [facility] [address], [place], [place], [address] Phone: [phone]   Fax:  [phone]  Name: Rebakah Cokley MRN: [id number] Date of Birth: 1961/10/03

## 2020-08-19 NOTE — Patient Instructions (Signed)
Stress Management and Relaxation Techniques There are two divisions in the nervous system that run many of our body's "behind the scenes" functions.  The "fight or flight" nervous system, and the "calming, rest and digest" nervous system.  These two systems have opposite effects on our body organs and systems and can impact our heart rate, blood pressure, breath rate, temperature, GI function, and experience of stress or calm.    Taking time to stimulate the "calming, rest and digest" part of our nervous system can help reduce experience of symptoms of chronic pain conditions, decrease stress and anxiety, and allow us to feel more equipped to handle challenges.  Below are strategies, techniques, and video suggestions to help stimulate this calming system.     Ways to Calm the Nervous System Yoga Meditation Mindfulness  Stretching Exercise Deep, slow breathing into belly (diaphragmatic breathing) with focused prolonged exhale Monotasking vs Multi-tasking (do one activity daily that is simple, focused, and slowly performed) Listen to your biorhythms: sleep when tired, rise when rested, eat when hungry, stop when full, etc Social connections - seek connections with others Laughter - laughing helps stimulate our "calming" nervous system Massage - by a practitioner or self-massage (example, feet for reflexology points) Singing or humming Cold exposure - try 30 sec of cold water at the end of your shower   Meditation, Yoga, Breathing, Stretching Video Suggestions [employer] YouTube Videos: Guided Meditation for Pelvic Floor Relaxation - [employer] YouTube video 10-Min Breath Meditation for Pelvic Health and Healing - [employer] YouTube video Pelvic Floor Release Stretches Bedtime Yoga for Pelvic Tension Pelvic Floor Release and Inner Thigh Stretch - Yoga for Pelvic Health (approx. 40 min) Progressive Muscle Relaxation Exercises - search Edmond Jacobson exercise videos on  YouTube Focused relaxation through guided relaxation, breathing, and contractions/relaxations of various muscle groups Autogenic Relaxation Technique - search videos on YouTube Uses body's natural relaxation response through guided meditation, inducing heaviness in body, and verbal stimuli/affirmations to create overall feeling of well-being, slowed breathing, reduced heart rate, reduced blood pressure, reduced stress/anxiety Sympathetic Breathing Meditation - search videos on YouTube Regulate the nervous system and restore calm through focused breathing to stimulate the parasympathetic nervous system (the opposite of our "fight or flight" sympathetic nervous system) Mindfulness Meditation - search videos on YouTube Focuses on choosing to be present in the moment, finding enjoyment in the now Diaphragmatic Breathing - search videos on YouTube Guided Imagery for Relaxation - search videos on YouTube  

## 2020-08-24 DIAGNOSIS — F332 Major depressive disorder, recurrent severe without psychotic features: Secondary | ICD-10-CM | POA: Diagnosis not present

## 2020-08-24 DIAGNOSIS — F411 Generalized anxiety disorder: Secondary | ICD-10-CM | POA: Diagnosis not present

## 2020-08-25 ENCOUNTER — Other Ambulatory Visit: Payer: Self-pay

## 2020-08-25 ENCOUNTER — Encounter: Payer: Self-pay | Admitting: Physical Therapy

## 2020-08-25 ENCOUNTER — Ambulatory Visit: Payer: BC Managed Care – PPO | Admitting: Physical Therapy

## 2020-08-25 DIAGNOSIS — R252 Cramp and spasm: Secondary | ICD-10-CM

## 2020-08-25 DIAGNOSIS — M546 Pain in thoracic spine: Secondary | ICD-10-CM

## 2020-08-25 DIAGNOSIS — M6281 Muscle weakness (generalized): Secondary | ICD-10-CM | POA: Diagnosis not present

## 2020-08-25 DIAGNOSIS — M545 Low back pain, unspecified: Secondary | ICD-10-CM | POA: Diagnosis not present

## 2020-08-25 DIAGNOSIS — G8929 Other chronic pain: Secondary | ICD-10-CM | POA: Diagnosis not present

## 2020-08-25 NOTE — Therapy (Signed)
[facility] [address], [place], [place], [address] Phone: [phone]   Fax:  [phone]  Physical Therapy Treatment  Patient Details  Name: Taylor Chen MRN: [id number] Date of Birth: 06-Apr-1961 Referring Provider (PT): [name], MD   Encounter Date: 08/25/2020   PT End of Session - 08/25/20 1013    Visit Number 5    Date for PT Re-Evaluation 09/30/20    Authorization Type BCBS    PT Start Time 1013    PT Stop Time 1056    PT Time Calculation (min) 43 min    Activity Tolerance Patient tolerated treatment well    Behavior During Therapy [facility] for tasks assessed/performed           Past Medical History:  Diagnosis Date  . Anemia    IN HER 20'S  . Anginal pain ([facility])    LAST COUPLE DAYS  . Anxiety   . Asthma    CHILDHOOD  . Depression   . Headache    MIGRAINES  . Heart murmur    HAS BEEN TOLD  . History of stress test 2012  . Hypertension    CONTROLLED BY DIET    Past Surgical History:  Procedure Laterality Date  . DILATATION & CURETTAGE/HYSTEROSCOPY WITH MYOSURE N/A 08/25/2016   Procedure: DILATATION & CURETTAGE/HYSTEROSCOPY WITH MYOSURE;  Surgeon: Linda Hedges, DO;  Location: [facility];  Service: Gynecology;  Laterality: N/A;  . HYSTOSCOPY  2010  . INCISION AND DRAINAGE  1999    There were no vitals filed for this visit.   Subjective Assessment - 08/25/20 1014    Subjective I haven't had time to try the videos you gave me.  Family matters have caused stress and distraction.  If I stretch every day my pain isn't as bad.    Pertinent History fell on coccyx yesterday down the stairs    Limitations Standing    How long can you stand comfortably? 10-15 min    How long can you walk comfortably? walks 1-2.5 hours/day but walks through the pain    Diagnostic tests xrays - negative    Patient Stated Goals get rid of pain    Currently in Pain? Yes    Pain Score 6     Pain Location Back    Pain  Orientation Right;Left;Lower    Pain Descriptors / Indicators Aching;Tightness;Sharp;Sore    Pain Type Chronic pain    Pain Onset More than a month ago    Pain Frequency Intermittent    Aggravating Factors  bend/stand    Pain Relieving Factors heat/meds                             [facility] Adult PT Treatment/Exercise - 08/25/20 0001      Lumbar Exercises: Stretches   Hip Flexor Stretch Left;Right;1 rep;30 seconds    Hip Flexor Stretch Limitations foot on 2nd step, UE overhead with SB    Pelvic Tilt 10 reps    Quadruped Mid Back Stretch 3 reps;10 seconds    Quad Stretch Left;Right;1 rep;30 seconds    Piriformis Stretch Left;Right;2 reps;20 seconds      Lumbar Exercises: Aerobic   Recumbent Bike L2 x 5' PT present to discuss symptoms      Lumbar Exercises: Standing   Other Standing Lumbar Exercises counter plank 1x20 sec      Lumbar Exercises: Quadruped   [place] 10 reps  Knee/Hip Exercises: Standing   Other Standing Knee Exercises hip abd and ext toe taps alt Rt/Lt x 5 each edge of TM      Manual Therapy   Manual Therapy Soft tissue mobilization;Myofascial release;Manual Traction;Joint mobilization    Joint Mobilization Gr II/III mid-thoracic PAs     Soft tissue mobilization thoracic and lumbar paraspinal broadening    Myofascial Release thoracodorsal fascia bil, suction cup midline lumbar to lateral lumbar bil    Manual Traction prone sacral and pelvic distraction bil 3x30 sec                    PT Short Term Goals - 07/15/20 1259      PT SHORT TERM GOAL #1   Title ind with initial HEP    Status On-going      PT SHORT TERM GOAL #3   Title Pt will be able to perform sit to stand with proper body mechanics, core engagement without bracing hands on knees with min pain x 5 reps    Status On-going      PT SHORT TERM GOAL #4   Title Improved bil hip flexibility to [facility] to reduce undue strain on spine.    Status On-going              PT Long Term Goals - 07/08/20 1350      PT LONG TERM GOAL #1   Title Pt will be ind with advanced HEP    Time 12    Period Weeks    Status New    Target Date 09/30/20      PT LONG TERM GOAL #2   Title Pt will be able to perform all tranitional movements for bed mobility, car and chair transfers with min pain.    Time 12    Period Weeks    Status New    Target Date 09/30/20      PT LONG TERM GOAL #3   Title Pt will be able to tolerate standing tasks for cooking/cleaning x 20 min with min pain.    Time 12    Period Weeks    Status New    Target Date 09/30/20      PT LONG TERM GOAL #4   Title FOTO reduced to <= 52% to demo less limitation    Baseline 76%    Time 12    Period Weeks    Status New    Target Date 09/30/20      PT LONG TERM GOAL #5   Title Pt will demo proper body mechanics and good stabilization of spine for bending/lifting to reduce chance of re-injury.    Time 12    Period Weeks    Status New    Target Date 09/30/20                 Plan - 08/25/20 1056    Clinical Impression Statement Pt reports stretches help reduce pain but that she lives with constant pain.  PT performed manual techniques paired with additional stretches for anterior hips, thighs and trunk to see if this reduces Pt's pain and ability to achieve more segmental and fascial mobility in thoracic/lumbar region.  Pt with good TA recruitment with counter plank today and improving stability in closed chain bil hips.  Continue along POC with ongoing assessment of response to interventions.    PT Frequency 2x / week    PT Duration 12 weeks    PT Treatment/Interventions ADLs/Self Care  Home Management;Moist Heat;Cryotherapy;Electrical Stimulation;Neuromuscular re-education;Balance training;Therapeutic exercise;Therapeutic activities;Functional mobility training;Patient/family education;Manual techniques;Dry needling;Passive range of motion;Spinal Manipulations;Joint Manipulations    PT  Next Visit Plan bike, stretch, manual therapy (STM lumbar, thoracic, ribcage release, breathwork), heat/STIM    PT Home Exercise Plan Access Code: [email or web address] + DN info    Consulted and Agree with Plan of Care Patient           Patient will benefit from skilled therapeutic intervention in order to improve the following deficits and impairments:     Visit Diagnosis: Chronic bilateral low back pain without sciatica  Pain in thoracic spine  Muscle weakness (generalized)  Cramp and spasm     Problem List There are no problems to display for this patient.   [name] Taylor Chen, PT 08/25/20 10:59 AM   [facility] [address] [place], [place], [address] Phone: [phone]   Fax:  [phone]  Name: Taylor Chen MRN: [id number] Date of Birth: 03-28-1961

## 2020-08-28 ENCOUNTER — Encounter: Payer: Self-pay | Admitting: Physical Therapy

## 2020-08-28 ENCOUNTER — Ambulatory Visit: Payer: BC Managed Care – PPO | Admitting: Physical Therapy

## 2020-08-28 ENCOUNTER — Other Ambulatory Visit: Payer: Self-pay

## 2020-08-28 DIAGNOSIS — M6281 Muscle weakness (generalized): Secondary | ICD-10-CM | POA: Diagnosis not present

## 2020-08-28 DIAGNOSIS — M545 Low back pain, unspecified: Secondary | ICD-10-CM | POA: Diagnosis not present

## 2020-08-28 DIAGNOSIS — R252 Cramp and spasm: Secondary | ICD-10-CM | POA: Diagnosis not present

## 2020-08-28 DIAGNOSIS — M546 Pain in thoracic spine: Secondary | ICD-10-CM | POA: Diagnosis not present

## 2020-08-28 DIAGNOSIS — G8929 Other chronic pain: Secondary | ICD-10-CM

## 2020-08-28 NOTE — Therapy (Signed)
[facility] [address], [place], [place], [address] Phone: [phone]   Fax:  [phone]  Physical Therapy Treatment  Patient Details  Name: Taylor Chen MRN: [id number] Date of Birth: Mar 25, 1961 Referring Provider (PT): [name], MD   Encounter Date: 08/28/2020   PT End of Session - 08/28/20 0932    Visit Number 6    Date for PT Re-Evaluation 09/30/20    Authorization Type BCBS    PT Start Time 0932    PT Stop Time 1013    PT Time Calculation (min) 41 min    Activity Tolerance Patient tolerated treatment well    Behavior During Therapy [facility] for tasks assessed/performed           Past Medical History:  Diagnosis Date  . Anemia    IN HER 20'S  . Anginal pain (HCC)    LAST COUPLE DAYS  . Anxiety   . Asthma    CHILDHOOD  . Depression   . Headache    MIGRAINES  . Heart murmur    HAS BEEN TOLD  . History of stress test 2012  . Hypertension    CONTROLLED BY DIET    Past Surgical History:  Procedure Laterality Date  . DILATATION & CURETTAGE/HYSTEROSCOPY WITH MYOSURE N/A 08/25/2016   Procedure: DILATATION & CURETTAGE/HYSTEROSCOPY WITH MYOSURE;  Surgeon: Linda Hedges, DO;  Location: [facility];  Service: Gynecology;  Laterality: N/A;  . HYSTOSCOPY  2010  . INCISION AND DRAINAGE  1999    There were no vitals filed for this visit.   Subjective Assessment - 08/28/20 0931    Subjective Last visit helped a lot - I feel much better.  Pain is less, maybe a 4-5/10.    Pertinent History fell on coccyx yesterday down the stairs    Limitations Standing    How long can you stand comfortably? 10-15 min    How long can you walk comfortably? walks 1-2.5 hours/day but walks through the pain    Diagnostic tests xrays - negative    Patient Stated Goals get rid of pain    Currently in Pain? Yes    Pain Score 4     Pain Location Back    Pain Orientation Right;Left;Lower    Pain Descriptors / Indicators  Aching;Tightness;Sharp;Sore    Pain Type Chronic pain    Pain Onset More than a month ago    Pain Frequency Intermittent    Aggravating Factors  bend/stand    Pain Relieving Factors heat/meds/stretching                             [facility] Adult PT Treatment/Exercise - 08/28/20 0001      Exercises   Exercises Lumbar;Knee/Hip;Shoulder      Lumbar Exercises: Aerobic   Recumbent Bike L2 x 5' PT present to discuss symptoms and review STGs      Lumbar Exercises: Supine   Other Supine Lumbar Exercises open books x 5 reps bil with ribcage expansion each rep   modified for Lt shoulder pain, no UE for Lt rotation     Lumbar Exercises: Quadruped   Madcat/Old Horse 5 reps    Other Quadruped Lumbar Exercises child's pose with SB Rt/Lt and straight 1x5 each      Knee/Hip Exercises: Stretches   Active Hamstring Stretch Left;Right;1 rep;20 seconds    Quad Stretch Both;1 rep;30 seconds    Gastroc Stretch Both;2 reps;30 seconds  Gastroc Stretch Limitations slant board      Shoulder Exercises: Standing   Row Strengthening;Both;10 reps;Theraband    Theraband Level (Shoulder Row) Level 3 (Green)    Diagonals Strengthening;Both;10 reps;Weights    Diagonals Limitations 15lb pulleys bil UE                    PT Short Term Goals - 08/28/20 0933      PT SHORT TERM GOAL #1   Title ind with initial HEP    Status Achieved      PT SHORT TERM GOAL #2   Title improve A/ROM for trunk to [facility] with min pain    Status Achieved      PT SHORT TERM GOAL #3   Title Pt will be able to perform sit to stand with proper body mechanics, core engagement without bracing hands on knees with min pain x 5 reps    Status Achieved      PT SHORT TERM GOAL #4   Title Improved bil hip flexibility to [facility] to reduce undue strain on spine.    Status Achieved      PT SHORT TERM GOAL #5   Title Reduced pain by at least 30% with daily walks.    Status Achieved             PT Long Term  Goals - 07/08/20 1350      PT LONG TERM GOAL #1   Title Pt will be ind with advanced HEP    Time 12    Period Weeks    Status New    Target Date 09/30/20      PT LONG TERM GOAL #2   Title Pt will be able to perform all tranitional movements for bed mobility, car and chair transfers with min pain.    Time 12    Period Weeks    Status New    Target Date 09/30/20      PT LONG TERM GOAL #3   Title Pt will be able to tolerate standing tasks for cooking/cleaning x 20 min with min pain.    Time 12    Period Weeks    Status New    Target Date 09/30/20      PT LONG TERM GOAL #4   Title FOTO reduced to <= 52% to demo less limitation    Baseline 76%    Time 12    Period Weeks    Status New    Target Date 09/30/20      PT LONG TERM GOAL #5   Title Pt will demo proper body mechanics and good stabilization of spine for bending/lifting to reduce chance of re-injury.    Time 12    Period Weeks    Status New    Target Date 09/30/20                 Plan - 08/28/20 0934    Clinical Impression Statement Pt with improved pain since last visit suggesting good response to manual techniques combined with stretching.  Pain on arrival was 4-5/10 vs consistent 8/10 it has been.  Pt has met all STGs demo'ing irmpoved trunk ROM and body mechanics and LE strength with sit to stand.  She has improving tension in lumbar and thoracic paraspinals.  PT added open books today with ribcage expansion which provided a good stretch and relief.  Pt needs ongoing cueing for standing posture and core cueing with static and dynamic standing tasks.  Continue along POC.    PT Next Visit Plan bike, stretch, manual therapy (STM lumbar, thoracic, ribcage release, breathwork), heat/STIM    PT Home Exercise Plan Access Code: [email or web address] + DN info    Consulted and Agree with Plan of Care Patient           Patient will benefit from skilled therapeutic intervention in order to improve the following deficits and  impairments:     Visit Diagnosis: Chronic bilateral low back pain without sciatica  Pain in thoracic spine  Muscle weakness (generalized)  Cramp and spasm     Problem List There are no problems to display for this patient.   [name] Taylor Chen, PT 08/28/20 10:16 AM   [facility] [address] [place], [place], [address] Phone: [phone]   Fax:  [phone]  Name: Taylor Chen MRN: [id number] Date of Birth: 1960/11/29

## 2020-08-31 ENCOUNTER — Encounter: Payer: Self-pay | Admitting: Physical Therapy

## 2020-08-31 ENCOUNTER — Ambulatory Visit: Payer: BC Managed Care – PPO | Admitting: Physical Therapy

## 2020-08-31 ENCOUNTER — Other Ambulatory Visit: Payer: Self-pay

## 2020-08-31 DIAGNOSIS — F332 Major depressive disorder, recurrent severe without psychotic features: Secondary | ICD-10-CM | POA: Diagnosis not present

## 2020-08-31 DIAGNOSIS — M545 Low back pain, unspecified: Secondary | ICD-10-CM | POA: Diagnosis not present

## 2020-08-31 DIAGNOSIS — G8929 Other chronic pain: Secondary | ICD-10-CM

## 2020-08-31 DIAGNOSIS — M6281 Muscle weakness (generalized): Secondary | ICD-10-CM

## 2020-08-31 DIAGNOSIS — M546 Pain in thoracic spine: Secondary | ICD-10-CM

## 2020-08-31 DIAGNOSIS — F411 Generalized anxiety disorder: Secondary | ICD-10-CM | POA: Diagnosis not present

## 2020-08-31 DIAGNOSIS — R252 Cramp and spasm: Secondary | ICD-10-CM | POA: Diagnosis not present

## 2020-08-31 NOTE — Therapy (Signed)
[facility] [address], [place], [place], [address] Phone: [phone]   Fax:  [phone]  Physical Therapy Treatment  Patient Details  Name: Taylor Chen MRN: [id number] Date of Birth: June 25, 1961 Referring Provider (PT): [name], MD   Encounter Date: 08/31/2020   PT End of Session - 08/31/20 0759    Visit Number 7    Date for PT Re-Evaluation 09/30/20    Authorization Type BCBS    PT Start Time 0800    PT Stop Time 0842    PT Time Calculation (min) 42 min    Activity Tolerance Patient tolerated treatment well    Behavior During Therapy [facility] for tasks assessed/performed           Past Medical History:  Diagnosis Date  . Anemia    IN HER 20'S  . Anginal pain (HCC)    LAST COUPLE DAYS  . Anxiety   . Asthma    CHILDHOOD  . Depression   . Headache    MIGRAINES  . Heart murmur    HAS BEEN TOLD  . History of stress test 2012  . Hypertension    CONTROLLED BY DIET    Past Surgical History:  Procedure Laterality Date  . DILATATION & CURETTAGE/HYSTEROSCOPY WITH MYOSURE N/A 08/25/2016   Procedure: DILATATION & CURETTAGE/HYSTEROSCOPY WITH MYOSURE;  Surgeon: Linda Hedges, DO;  Location: [facility];  Service: Gynecology;  Laterality: N/A;  . HYSTOSCOPY  2010  . INCISION AND DRAINAGE  1999    There were no vitals filed for this visit.   Subjective Assessment - 08/31/20 0807    Subjective I was at the park walking x 20 min and standing x 1 hour at dog park and was in a lot of pain at that point.    Limitations Standing    How long can you stand comfortably? 20 min    How long can you walk comfortably? walks 1-2.5 hours/day but walks through the pain    Diagnostic tests xrays - negative    Patient Stated Goals get rid of pain    Currently in Pain? Yes    Pain Score 6     Pain Location Back    Pain Orientation Right;Left;Lower    Pain Descriptors / Indicators Aching;Tightness    Pain Onset More  than a month ago    Pain Frequency Intermittent    Aggravating Factors  prolonged stand and walk    Pain Relieving Factors heat/meds, stretching                             [facility] Adult PT Treatment/Exercise - 08/31/20 0001      Exercises   Exercises Lumbar;Knee/Hip      Lumbar Exercises: Stretches   Pelvic Tilt 10 reps    Quadruped Mid Back Stretch 3 reps;10 seconds    Quad Stretch Left;Right;1 rep;30 seconds    Piriformis Stretch 1 rep;30 seconds;Right;Left    Figure 4 Stretch 1 rep;30 seconds;With overpressure    Figure 4 Stretch Limitations bil    Other Lumbar Stretch Exercise seated thoracic rotation 3x10 bil      Lumbar Exercises: Aerobic   Recumbent Bike L2 x 5' PT preset to review LTGs      Lumbar Exercises: Standing   Other Standing Lumbar Exercises counter plank 2x20 sec      Lumbar Exercises: Supine   Dead Bug 10 reps    Dead  Bug Limitations then feet on red ball x 10    Straight Leg Raise 10 reps    Other Supine Lumbar Exercises feet on red ball roll in/out x 10 with TA cueing      Lumbar Exercises: Sidelying   Clam 10 reps;Right;Left    Clam Limitations green loop band      Knee/Hip Exercises: Standing   Hip Abduction Stengthening;Both;2 sets;5 reps;Knee straight    Hip Extension Stengthening;Right;Left;Knee straight;2 sets;5 reps      Manual Therapy   Manual Therapy Soft tissue mobilization;Joint mobilization    Joint Mobilization Gr II/III mid-thoracic PAs     Soft tissue mobilization Lt thoracic paraspinals and QL                    PT Short Term Goals - 08/28/20 0933      PT SHORT TERM GOAL #1   Title ind with initial HEP    Status Achieved      PT SHORT TERM GOAL #2   Title improve A/ROM for trunk to [facility] with min pain    Status Achieved      PT SHORT TERM GOAL #3   Title Pt will be able to perform sit to stand with proper body mechanics, core engagement without bracing hands on knees with min pain x 5 reps     Status Achieved      PT SHORT TERM GOAL #4   Title Improved bil hip flexibility to [facility] to reduce undue strain on spine.    Status Achieved      PT SHORT TERM GOAL #5   Title Reduced pain by at least 30% with daily walks.    Status Achieved             PT Long Term Goals - 08/31/20 0800      PT LONG TERM GOAL #1   Title Pt will be ind with advanced HEP    Status On-going      PT LONG TERM GOAL #2   Title Pt will be able to perform all tranitional movements for bed mobility, car and chair transfers with min pain.    Status Achieved      PT LONG TERM GOAL #3   Title Pt will be able to tolerate standing tasks for cooking/cleaning x 20 min with min pain.    Status On-going      PT LONG TERM GOAL #4   Title FOTO reduced to <= 52% to demo less limitation    Baseline 53% on 08/31/20    Status On-going      PT LONG TERM GOAL #5   Title Pt will demo proper body mechanics and good stabilization of spine for bending/lifting to reduce chance of re-injury.    Status Achieved                 Plan - 08/31/20 0824    Clinical Impression Statement Pt is making good progress toward LTGs.  FOTO score reduced from 76% to 54% limited, nearly meeting 53% goal.  LE flexibility and soft tissue tension along lumbar and thoracic paraspinals is much improved.  Pt continues to need cueing in standing for postural alignment.  She has core weakness but is tolerating more ther ex during sessions.  She has pain with prolonged standing and walking but is unsure how long her tolerance is at this time.  She will continue to benefit from skilled PT along POC with increased focus on core  and postural stab.    Rehab Potential Excellent    PT Frequency 2x / week    PT Duration 12 weeks    PT Treatment/Interventions ADLs/Self Care Home Management;Moist Heat;Cryotherapy;Electrical Stimulation;Neuromuscular re-education;Balance training;Therapeutic exercise;Therapeutic activities;Functional mobility  training;Patient/family education;Manual techniques;Dry needling;Passive range of motion;Spinal Manipulations;Joint Manipulations    PT Next Visit Plan continue stretching, spine ROM, STM and core/postural strength    PT Home Exercise Plan Access Code: [email or web address] + DN info    Consulted and Agree with Plan of Care Patient           Patient will benefit from skilled therapeutic intervention in order to improve the following deficits and impairments:     Visit Diagnosis: Chronic bilateral low back pain without sciatica  Pain in thoracic spine  Muscle weakness (generalized)     Problem List There are no problems to display for this patient.   [name] Parrish Daddario, PT 08/31/20 8:44 AM   [facility] [address], [place], [place], [address] Phone: [phone]   Fax:  [phone]  Name: Taylor Chen MRN: [id number] Date of Birth: [birth year]

## 2020-09-02 ENCOUNTER — Encounter: Payer: BC Managed Care – PPO | Admitting: Physical Therapy

## 2020-09-07 DIAGNOSIS — F332 Major depressive disorder, recurrent severe without psychotic features: Secondary | ICD-10-CM | POA: Diagnosis not present

## 2020-09-07 DIAGNOSIS — F411 Generalized anxiety disorder: Secondary | ICD-10-CM | POA: Diagnosis not present

## 2020-09-17 DIAGNOSIS — F411 Generalized anxiety disorder: Secondary | ICD-10-CM | POA: Diagnosis not present

## 2020-09-17 DIAGNOSIS — F332 Major depressive disorder, recurrent severe without psychotic features: Secondary | ICD-10-CM | POA: Diagnosis not present

## 2020-09-24 DIAGNOSIS — F332 Major depressive disorder, recurrent severe without psychotic features: Secondary | ICD-10-CM | POA: Diagnosis not present

## 2020-09-24 DIAGNOSIS — F411 Generalized anxiety disorder: Secondary | ICD-10-CM | POA: Diagnosis not present

## 2020-09-30 ENCOUNTER — Ambulatory Visit: Payer: BC Managed Care – PPO | Attending: Family Medicine | Admitting: Physical Therapy

## 2020-09-30 ENCOUNTER — Other Ambulatory Visit: Payer: Self-pay

## 2020-09-30 ENCOUNTER — Encounter: Payer: Self-pay | Admitting: Physical Therapy

## 2020-09-30 DIAGNOSIS — M6281 Muscle weakness (generalized): Secondary | ICD-10-CM | POA: Diagnosis not present

## 2020-09-30 DIAGNOSIS — G8929 Other chronic pain: Secondary | ICD-10-CM | POA: Diagnosis not present

## 2020-09-30 DIAGNOSIS — M546 Pain in thoracic spine: Secondary | ICD-10-CM

## 2020-09-30 DIAGNOSIS — M545 Low back pain, unspecified: Secondary | ICD-10-CM | POA: Diagnosis not present

## 2020-09-30 DIAGNOSIS — R252 Cramp and spasm: Secondary | ICD-10-CM | POA: Insufficient documentation

## 2020-09-30 NOTE — Therapy (Signed)
[facility] [address] [place], [place], [address] Phone: [phone]   Fax:  [phone]  Physical Therapy Treatment  Patient Details  Name: Taylor Chen MRN: [id number] Date of Birth: [birth year] Referring Provider (PT): [name], MD   Encounter Date: 09/30/2020   PT End of Session - 09/30/20 1018    Visit Number 8    Date for PT Re-Evaluation 12/09/20    Authorization Type BCBS    PT Start Time 0930    PT Stop Time 1013    PT Time Calculation (min) 43 min    Activity Tolerance Patient tolerated treatment well    Behavior During Therapy [facility] for tasks assessed/performed           Past Medical History:  Diagnosis Date  . Anemia    IN HER 20'S  . Anginal pain (HCC)    LAST COUPLE DAYS  . Anxiety   . Asthma    CHILDHOOD  . Depression   . Headache    MIGRAINES  . Heart murmur    HAS BEEN TOLD  . History of stress test 2012  . Hypertension    CONTROLLED BY DIET    Past Surgical History:  Procedure Laterality Date  . DILATATION & CURETTAGE/HYSTEROSCOPY WITH MYOSURE N/A 08/25/2016   Procedure: DILATATION & CURETTAGE/HYSTEROSCOPY WITH MYOSURE;  Surgeon: Linda Hedges, DO;  Location: [facility];  Service: Gynecology;  Laterality: N/A;  . HYSTOSCOPY  2010  . INCISION AND DRAINAGE  1999    There were no vitals filed for this visit.   Subjective Assessment - 09/30/20 0936    Subjective Pt returns following 1 month break from PT with new referral from MD to continue lumbar PT.  I feel 50-60% better since starting PT and doing the stretches.    Limitations Standing;Walking    How long can you stand comfortably? 1 hour    How long can you walk comfortably? 1 hour    Diagnostic tests xrays - negative    Patient Stated Goals get rid of pain    Currently in Pain? Yes    Pain Score 6     Pain Location Back    Pain Orientation Right;Left;Lower    Pain Descriptors / Indicators Aching;Tightness    Pain  Type Chronic pain    Pain Onset More than a month ago    Pain Frequency Intermittent    Aggravating Factors  prolonged stand and walk    Pain Relieving Factors heat/meds, stretch              [facility] PT Assessment - 09/30/20 0001      Assessment   Medical Diagnosis M54.5 (ICD-10-CM) - Low back pain    Referring Provider (PT) [name], MD    Onset Date/Surgical Date --   04/19/2019   Next MD Visit no    Prior Therapy yes for back pain in past      Observation/Other Assessments   Focus on Therapeutic Outcomes (FOTO)  53%      ROM / Strength   AROM / PROM / Strength AROM;Strength      AROM   AROM Assessment Site Lumbar;Thoracic    Lumbar Flexion full but with Rt central low back pain    Lumbar - Right Side Bend WNL    Lumbar - Left Side Bend WNL    Thoracic - Right Rotation limited 25%    Thoracic - Left Rotation limited 25%  PROM   Overall PROM Comments bil hip ER limited 20%      Strength   Overall Strength Comments core 4-/5, hip rotators 4+/5      Flexibility   Soft Tissue Assessment /Muscle Length yes    Piriformis limited 25% bil      Palpation   Spinal mobility limited lumbar gapping Lt>Rt L4-S1    Palpation comment signif mid-thoracic paraspinal tension, Lt>Rt deep multifidus lumbar L3-S1 tightness                         [facility] Adult PT Treatment/Exercise - 09/30/20 0001      Lumbar Exercises: Stretches   Single Knee to Chest Stretch 3 reps;10 seconds    Lower Trunk Rotation 5 reps    Pelvic Tilt 10 reps    Piriformis Stretch 1 rep;30 seconds;Right;Left    Figure 4 Stretch 1 rep;30 seconds;With overpressure    Figure 4 Stretch Limitations bil      Manual Therapy   Manual Therapy Soft tissue mobilization    Joint Mobilization Gr II/III mid-thoracic PAs     Soft tissue mobilization bil mid-thoracic broadening and stripping    Myofascial Release thoracodorsal fascia bil, obliques bil                    PT Short Term  Goals - 08/28/20 0933      PT SHORT TERM GOAL #1   Title ind with initial HEP    Status Achieved      PT SHORT TERM GOAL #2   Title improve A/ROM for trunk to [facility] with min pain    Status Achieved      PT SHORT TERM GOAL #3   Title Pt will be able to perform sit to stand with proper body mechanics, core engagement without bracing hands on knees with min pain x 5 reps    Status Achieved      PT SHORT TERM GOAL #4   Title Improved bil hip flexibility to [facility] to reduce undue strain on spine.    Status Achieved      PT SHORT TERM GOAL #5   Title Reduced pain by at least 30% with daily walks.    Status Achieved             PT Long Term Goals - 09/30/20 0941      PT LONG TERM GOAL #1   Title Pt will be ind with advanced HEP    Baseline needs more core in HEP    Time 12    Period Weeks    Status On-going    Target Date 12/23/20      PT LONG TERM GOAL #2   Title Pt will be able to perform all tranitional movements for bed mobility, car and chair transfers with min pain.    Status Achieved      PT LONG TERM GOAL #3   Title Pt will be able to tolerate standing tasks for cooking/cleaning x 20 min with min pain.    Time 12    Period Weeks    Status On-going    Target Date 12/23/20      PT LONG TERM GOAL #4   Title FOTO reduced to <= 52% to demo less limitation    Baseline 53% on 08/31/20    Status On-going    Target Date 12/23/20      PT LONG TERM GOAL #5   Title Pt  will demo proper body mechanics and good stabilization of spine for bending/lifting to reduce chance of re-injury.    Status Achieved      Additional Long Term Goals   Additional Long Term Goals Yes      PT LONG TERM GOAL #6   Title Pt will demo improved gait stability with improved stance phase use of gluteals to reduce LBP with exercise walks.    Time 12    Period Weeks    Status New    Target Date 12/23/20                 Plan - 09/30/20 1239    Clinical Impression Statement Pt returns to  clinic for re-evaluation after a 1 month break from PT.  She reports 50-60% improvement in pain since starting PT.  She is most helped by hip and trunk stretches.  She has 6/10 pain with household tasks and prolonged standing and walking.  Walking tolerance before pain has improved from 39min to 1 hour.  She has improved strength in hips bil but continues to have core weakness and lacks postural awareness in standing.  She has back gripping strategy in thoracic paraspinals and increased tightness and fair mobility in lumbar spine.  She lacks use of gluteals in gait and has increased frontal plane motion in stance phase.  She will benefit from continued PT with focus on trunk mobility and core/pelvic strength progression to address deficits.    Personal Factors and Comorbidities Time since onset of injury/illness/exacerbation    Examination-Activity Limitations Lift;Bend;Bed Mobility;Transfers;Sleep;Squat;Stairs;Stand    Examination-Participation Restrictions Community Activity;Laundry;Meal Prep;Cleaning;Shop;Yard Work    Stability/Clinical Decision Making Stable/Uncomplicated    Clinical Decision Making Low    Rehab Potential Excellent    PT Frequency 2x / week    PT Duration 12 weeks    PT Treatment/Interventions ADLs/Self Care Home Management;Moist Heat;Cryotherapy;Electrical Stimulation;Neuromuscular re-education;Balance training;Therapeutic exercise;Therapeutic activities;Functional mobility training;Patient/family education;Manual techniques;Dry needling;Passive range of motion;Spinal Manipulations;Joint Manipulations    PT Next Visit Plan DN lumbar multifidus bil, continue stretching, spine ROM, STM and core/postural strength, gait for stance phase glut use    PT Home Exercise Plan Access Code: [email or web address] + DN info    Consulted and Agree with Plan of Care Patient           Patient will benefit from skilled therapeutic intervention in order to improve the following deficits and impairments:   Decreased range of motion,Increased muscle spasms,Decreased strength,Decreased mobility,Hypomobility,Impaired flexibility,Improper body mechanics,Pain,Postural dysfunction  Visit Diagnosis: Chronic bilateral low back pain without sciatica - Plan: PT plan of care cert/re-cert  Pain in thoracic spine - Plan: PT plan of care cert/re-cert  Muscle weakness (generalized) - Plan: PT plan of care cert/re-cert  Cramp and spasm - Plan: PT plan of care cert/re-cert     Problem List There are no problems to display for this patient.   [name] Olayinka Gathers, PT 09/30/20 12:49 PM   [facility] [address], [place], [place], [address] Phone: [phone]   Fax:  [phone]  Name: Taylor Chen MRN: [id number] Date of Birth: [birth year]

## 2020-10-01 DIAGNOSIS — F411 Generalized anxiety disorder: Secondary | ICD-10-CM | POA: Diagnosis not present

## 2020-10-01 DIAGNOSIS — F332 Major depressive disorder, recurrent severe without psychotic features: Secondary | ICD-10-CM | POA: Diagnosis not present

## 2020-10-06 DIAGNOSIS — H40023 Open angle with borderline findings, high risk, bilateral: Secondary | ICD-10-CM | POA: Diagnosis not present

## 2020-10-06 DIAGNOSIS — H2513 Age-related nuclear cataract, bilateral: Secondary | ICD-10-CM | POA: Diagnosis not present

## 2020-10-06 DIAGNOSIS — H25013 Cortical age-related cataract, bilateral: Secondary | ICD-10-CM | POA: Diagnosis not present

## 2020-10-22 DIAGNOSIS — F411 Generalized anxiety disorder: Secondary | ICD-10-CM | POA: Diagnosis not present

## 2020-10-26 DIAGNOSIS — F411 Generalized anxiety disorder: Secondary | ICD-10-CM | POA: Diagnosis not present

## 2020-10-28 ENCOUNTER — Other Ambulatory Visit: Payer: Self-pay

## 2020-10-28 ENCOUNTER — Ambulatory Visit: Payer: BC Managed Care – PPO | Attending: Family Medicine | Admitting: Physical Therapy

## 2020-10-28 ENCOUNTER — Encounter: Payer: Self-pay | Admitting: Physical Therapy

## 2020-10-28 DIAGNOSIS — G8929 Other chronic pain: Secondary | ICD-10-CM

## 2020-10-28 DIAGNOSIS — R252 Cramp and spasm: Secondary | ICD-10-CM | POA: Diagnosis not present

## 2020-10-28 DIAGNOSIS — M545 Low back pain, unspecified: Secondary | ICD-10-CM | POA: Insufficient documentation

## 2020-10-28 DIAGNOSIS — M546 Pain in thoracic spine: Secondary | ICD-10-CM | POA: Diagnosis not present

## 2020-10-28 DIAGNOSIS — M6281 Muscle weakness (generalized): Secondary | ICD-10-CM

## 2020-10-28 NOTE — Therapy (Signed)
[facility] [address], [address] [place], [place], [address] Phone: [phone]   Fax:  [phone]  Physical Therapy Treatment  Patient Details  Name: Taylor Chen MRN: [id number] Date of Birth: 07/04/1961 Referring Provider (PT): Ileana LaddWong, Francis P, MD   Encounter Date: 10/28/2020   PT End of Session - 10/28/20 1059    Visit Number 9    Date for PT Re-Evaluation 12/09/20    Authorization Type BCBS    PT Start Time 1017    PT Stop Time 1058    PT Time Calculation (min) 41 min    Activity Tolerance Patient tolerated treatment well    Behavior During Therapy [facility] for tasks assessed/performed           Past Medical History:  Diagnosis Date  . Anemia    IN HER 20'S  . Anginal pain (HCC)    LAST COUPLE DAYS  . Anxiety   . Asthma    CHILDHOOD  . Depression   . Headache    MIGRAINES  . Heart murmur    HAS BEEN TOLD  . History of stress test 2012  . Hypertension    CONTROLLED BY DIET    Past Surgical History:  Procedure Laterality Date  . DILATATION & CURETTAGE/HYSTEROSCOPY WITH MYOSURE N/A 08/25/2016   Procedure: DILATATION & CURETTAGE/HYSTEROSCOPY WITH MYOSURE;  Surgeon: Mitchel HonourMegan Morris, DO;  Location: [facility];  Service: Gynecology;  Laterality: N/A;  . HYSTOSCOPY  2010  . INCISION AND DRAINAGE  1999    There were no vitals filed for this visit.   Subjective Assessment - 10/28/20 1020    Subjective I feel about 60% improved with stretching.  I am stretching daily in the tub.    Pertinent History fell on coccyx yesterday down the stairs    Limitations Standing;Walking    How long can you stand comfortably? 1 hour    How long can you walk comfortably? 1 hour    Diagnostic tests xrays - negative    Patient Stated Goals get rid of pain    Currently in Pain? Yes    Pain Score 6     Pain Location Back    Pain Orientation Right;Left;Lower    Pain Descriptors / Indicators Aching;Tightness    Pain Onset More than a  month ago    Pain Frequency Intermittent    Aggravating Factors  prolonged stand and walk    Pain Relieving Factors heat/meds/stretch                             [facility] Adult PT Treatment/Exercise - 10/28/20 0001      Exercises   Exercises Lumbar      Lumbar Exercises: Stretches   Quadruped Mid Back Stretch Limitations thread the needle 2x10 sec each way    Piriformis Stretch 1 rep;Left;Right;30 seconds    Figure 4 Stretch 1 rep;30 seconds;With overpressure    Figure 4 Stretch Limitations bil    Other Lumbar Stretch Exercise prayer and prayer with SB 2x10 each      Manual Therapy   Manual Therapy Soft tissue mobilization;Myofascial release    Soft tissue mobilization lumbar multifidi and paraspinals, thoracic paraspinals    Myofascial Release suction cup lumbar region thoracolumbar fascia bil            Trigger Point Dry Needling - 10/28/20 0001    Consent Given? Yes    Education Handout Provided Previously provided  Muscles Treated Back/Hip Lumbar multifidi    Dry Needling Comments bil L4-S1    Lumbar multifidi Response Twitch response elicited;Palpable increased muscle length                  PT Short Term Goals - 08/28/20 0933      PT SHORT TERM GOAL #1   Title ind with initial HEP    Status Achieved      PT SHORT TERM GOAL #2   Title improve A/ROM for trunk to [facility] with min pain    Status Achieved      PT SHORT TERM GOAL #3   Title Pt will be able to perform sit to stand with proper body mechanics, core engagement without bracing hands on knees with min pain x 5 reps    Status Achieved      PT SHORT TERM GOAL #4   Title Improved bil hip flexibility to [facility] to reduce undue strain on spine.    Status Achieved      PT SHORT TERM GOAL #5   Title Reduced pain by at least 30% with daily walks.    Status Achieved             PT Long Term Goals - 09/30/20 0941      PT LONG TERM GOAL #1   Title Pt will be ind with advanced HEP     Baseline needs more core in HEP    Time 12    Period Weeks    Status On-going    Target Date 12/23/20      PT LONG TERM GOAL #2   Title Pt will be able to perform all tranitional movements for bed mobility, car and chair transfers with min pain.    Status Achieved      PT LONG TERM GOAL #3   Title Pt will be able to tolerate standing tasks for cooking/cleaning x 20 min with min pain.    Time 12    Period Weeks    Status On-going    Target Date 12/23/20      PT LONG TERM GOAL #4   Title FOTO reduced to <= 52% to demo less limitation    Baseline 53% on 08/31/20    Status On-going    Target Date 12/23/20      PT LONG TERM GOAL #5   Title Pt will demo proper body mechanics and good stabilization of spine for bending/lifting to reduce chance of re-injury.    Status Achieved      Additional Long Term Goals   Additional Long Term Goals Yes      PT LONG TERM GOAL #6   Title Pt will demo improved gait stability with improved stance phase use of gluteals to reduce LBP with exercise walks.    Time 12    Period Weeks    Status New    Target Date 12/23/20                 Plan - 10/28/20 1158    Clinical Impression Statement Pt reports 60% improvement in LBP now that she is more compliant with stretching at home.  Pain rating was 6/10 on arrival and described as stiffness and aching.  Pt has ongoing myofascial tension in thoracodorsal fascia bil with poor skin rolling along midline of lumbar spine.  Lower lumbar multifidus TPs and tension are present and Pt agreed to try DN for the first time today with good tolerance and release  Lt>Rt.  Improved fascial mobility using suction cup today.  PT added quadruped thoracic "thread the needle" today and reviewed prayer with SB as Pt was not doing this correctly to feel the targeted stretch. Pt reported feeling taller and less tension end of session.  Continue along POC to target restrictions.    Rehab Potential Excellent    PT Frequency 2x  / week    PT Duration 12 weeks    PT Treatment/Interventions ADLs/Self Care Home Management;Moist Heat;Cryotherapy;Electrical Stimulation;Neuromuscular re-education;Balance training;Therapeutic exercise;Therapeutic activities;Functional mobility training;Patient/family education;Manual techniques;Dry needling;Passive range of motion;Spinal Manipulations;Joint Manipulations    PT Next Visit Plan f/u on DN lumbar multifidus, suction cup lumbar spine, continue stretching, quadruped thoracic ROM/stretching and row    PT Home Exercise Plan Access Code: [email or web address] + DN info    Consulted and Agree with Plan of Care Patient           Patient will benefit from skilled therapeutic intervention in order to improve the following deficits and impairments:     Visit Diagnosis: Chronic bilateral low back pain without sciatica  Pain in thoracic spine  Muscle weakness (generalized)  Cramp and spasm     Problem List There are no problems to display for this patient.   [name] Beuhring, PT 10/28/20 12:37 PM   [facility] [address], [place], [place], [address] Phone: [phone]   Fax:  [phone]  Name: Taylor Chen MRN: [id number] Date of Birth: Dec 30, 1960

## 2020-10-29 DIAGNOSIS — F411 Generalized anxiety disorder: Secondary | ICD-10-CM | POA: Diagnosis not present

## 2020-10-30 ENCOUNTER — Ambulatory Visit: Payer: BC Managed Care – PPO | Admitting: Physical Therapy

## 2020-10-30 ENCOUNTER — Other Ambulatory Visit: Payer: Self-pay

## 2020-10-30 ENCOUNTER — Encounter: Payer: Self-pay | Admitting: Physical Therapy

## 2020-10-30 DIAGNOSIS — R252 Cramp and spasm: Secondary | ICD-10-CM | POA: Diagnosis not present

## 2020-10-30 DIAGNOSIS — M545 Low back pain, unspecified: Secondary | ICD-10-CM | POA: Diagnosis not present

## 2020-10-30 DIAGNOSIS — M6281 Muscle weakness (generalized): Secondary | ICD-10-CM | POA: Diagnosis not present

## 2020-10-30 DIAGNOSIS — G8929 Other chronic pain: Secondary | ICD-10-CM

## 2020-10-30 DIAGNOSIS — M546 Pain in thoracic spine: Secondary | ICD-10-CM | POA: Diagnosis not present

## 2020-10-30 NOTE — Therapy (Signed)
[facility] [address], [place], [place], [address] Phone: [phone]   Fax:  [phone]  Physical Therapy Treatment  Patient Details  Name: Taylor Chen MRN: [id number] Date of Birth: [birth year] Referring Provider (PT): [name], MD   Encounter Date: 10/30/2020   PT End of Session - 10/30/20 0938    Visit Number 10    Date for PT Re-Evaluation 12/09/20    Authorization Type BCBS    PT Start Time 0940   Pt late   PT Stop Time 1013    PT Time Calculation (min) 33 min    Activity Tolerance Patient tolerated treatment well    Behavior During Therapy [facility] for tasks assessed/performed           Past Medical History:  Diagnosis Date  . Anemia    IN HER 20'S  . Anginal pain (HCC)    LAST COUPLE DAYS  . Anxiety   . Asthma    CHILDHOOD  . Depression   . Headache    MIGRAINES  . Heart murmur    HAS BEEN TOLD  . History of stress test 2012  . Hypertension    CONTROLLED BY DIET    Past Surgical History:  Procedure Laterality Date  . DILATATION & CURETTAGE/HYSTEROSCOPY WITH MYOSURE N/A 08/25/2016   Procedure: DILATATION & CURETTAGE/HYSTEROSCOPY WITH MYOSURE;  Surgeon: Linda Hedges, DO;  Location: [facility];  Service: Gynecology;  Laterality: N/A;  . HYSTOSCOPY  2010  . INCISION AND DRAINAGE  1999    There were no vitals filed for this visit.   Subjective Assessment - 10/30/20 0939    Subjective Did well with the DN last time.  I feel like the release is lasting.    Pertinent History fell on coccyx yesterday down the stairs    Limitations Standing;Walking    How long can you stand comfortably? 1 hour    How long can you walk comfortably? 1 hour    Diagnostic tests xrays - negative    Patient Stated Goals get rid of pain    Currently in Pain? No/denies                             [facility] Adult PT Treatment/Exercise - 10/30/20 0001      Exercises   Exercises Lumbar;Knee/Hip       Lumbar Exercises: Stretches   Lower Trunk Rotation Limitations 10 reps A/ROM    Pelvic Tilt 3 reps;10 reps    Piriformis Stretch Right;Left;1 rep;30 seconds    Piriformis Stretch Limitations edge of mat table    Other Lumbar Stretch Exercise seated blue ball rollouts for trunk flex/flex with SB 1x15 sec each way    Other Lumbar Stretch Exercise hamstring and adductor with strap supine bil 1x30 each      Lumbar Exercises: Aerobic   Recumbent Bike L2 x 5' PT preset to review LTGs      Lumbar Exercises: Sidelying   Other Sidelying Lumbar Exercises open books x 5 [address]      Lumbar Exercises: Quadruped   [place] 5 reps    Other Quadruped Lumbar Exercises thoracic thread the needle, then thoracic rotation hand of back of head x 5 reps    Other Quadruped Lumbar Exercises UE row holding dumbbells Rt/Lt 5lb x 10 reps      Knee/Hip Exercises: Stretches   Sports administrator Both;2 reps;20 seconds    [employer]  Stretch Limitations standing    Hip Flexor Stretch Both;2 reps;20 seconds    Hip Flexor Stretch Limitations foot on 2nd step                    PT Short Term Goals - 08/28/20 0933      PT SHORT TERM GOAL #1   Title ind with initial HEP    Status Achieved      PT SHORT TERM GOAL #2   Title improve A/ROM for trunk to [facility] with min pain    Status Achieved      PT SHORT TERM GOAL #3   Title Pt will be able to perform sit to stand with proper body mechanics, core engagement without bracing hands on knees with min pain x 5 reps    Status Achieved      PT SHORT TERM GOAL #4   Title Improved bil hip flexibility to [facility] to reduce undue strain on spine.    Status Achieved      PT SHORT TERM GOAL #5   Title Reduced pain by at least 30% with daily walks.    Status Achieved             PT Long Term Goals - 09/30/20 0941      PT LONG TERM GOAL #1   Title Pt will be ind with advanced HEP    Baseline needs more core in HEP    Time 12    Period Weeks    Status  On-going    Target Date 12/23/20      PT LONG TERM GOAL #2   Title Pt will be able to perform all tranitional movements for bed mobility, car and chair transfers with min pain.    Status Achieved      PT LONG TERM GOAL #3   Title Pt will be able to tolerate standing tasks for cooking/cleaning x 20 min with min pain.    Time 12    Period Weeks    Status On-going    Target Date 12/23/20      PT LONG TERM GOAL #4   Title FOTO reduced to <= 52% to demo less limitation    Baseline 53% on 08/31/20    Status On-going    Target Date 12/23/20      PT LONG TERM GOAL #5   Title Pt will demo proper body mechanics and good stabilization of spine for bending/lifting to reduce chance of re-injury.    Status Achieved      Additional Long Term Goals   Additional Long Term Goals Yes      PT LONG TERM GOAL #6   Title Pt will demo improved gait stability with improved stance phase use of gluteals to reduce LBP with exercise walks.    Time 12    Period Weeks    Status New    Target Date 12/23/20                 Plan - 10/30/20 0943    Clinical Impression Statement Pt arrives without pain for the first time at a session.  She felt the DN last time really provided good release and has lasted.  She continues to be non-compliant x 1.5 weeks with full HEP but does consistently stretch her back daily which helps.  Session focused on trunk and LE stretches to re-iterate benefits of this.  Manual for further STM/myofascial release and possible DN next session depending on tissue  presentation.    Rehab Potential Excellent    PT Frequency 2x / week    PT Duration 12 weeks    PT Treatment/Interventions ADLs/Self Care Home Management;Moist Heat;Cryotherapy;Electrical Stimulation;Neuromuscular re-education;Balance training;Therapeutic exercise;Therapeutic activities;Functional mobility training;Patient/family education;Manual techniques;Dry needling;Passive range of motion;Spinal Manipulations;Joint  Manipulations    PT Next Visit Plan f/u on DN lumbar multifidus, suction cup lumbar spine, continue stretching, quadruped thoracic ROM/stretching and row    PT Home Exercise Plan Access Code: [email or web address] + DN info    Consulted and Agree with Plan of Care Patient           Patient will benefit from skilled therapeutic intervention in order to improve the following deficits and impairments:     Visit Diagnosis: Chronic bilateral low back pain without sciatica  Pain in thoracic spine  Muscle weakness (generalized)  Cramp and spasm     Problem List There are no problems to display for this patient.   [name] Raejean Swinford, PT 10/30/20 10:13 AM   [facility] [address], [place], [place], [address] Phone: [phone]   Fax:  [phone]  Name: Taylor Chen MRN: [id number] Date of Birth: [birth year]

## 2020-11-02 DIAGNOSIS — F411 Generalized anxiety disorder: Secondary | ICD-10-CM | POA: Diagnosis not present

## 2020-11-04 ENCOUNTER — Ambulatory Visit: Payer: BC Managed Care – PPO | Admitting: Physical Therapy

## 2020-11-05 DIAGNOSIS — F411 Generalized anxiety disorder: Secondary | ICD-10-CM | POA: Diagnosis not present

## 2020-11-06 ENCOUNTER — Ambulatory Visit: Payer: BC Managed Care – PPO | Admitting: Physical Therapy

## 2020-11-06 ENCOUNTER — Other Ambulatory Visit: Payer: Self-pay

## 2020-11-06 ENCOUNTER — Encounter: Payer: Self-pay | Admitting: Physical Therapy

## 2020-11-06 DIAGNOSIS — G8929 Other chronic pain: Secondary | ICD-10-CM

## 2020-11-06 DIAGNOSIS — M546 Pain in thoracic spine: Secondary | ICD-10-CM | POA: Diagnosis not present

## 2020-11-06 DIAGNOSIS — M6281 Muscle weakness (generalized): Secondary | ICD-10-CM | POA: Diagnosis not present

## 2020-11-06 DIAGNOSIS — M545 Low back pain, unspecified: Secondary | ICD-10-CM

## 2020-11-06 DIAGNOSIS — R252 Cramp and spasm: Secondary | ICD-10-CM | POA: Diagnosis not present

## 2020-11-06 NOTE — Therapy (Signed)
[facility] [address] [place], [place], [address] Phone: [phone]   Fax:  [phone]  Physical Therapy Treatment  Patient Details  Name: Katharin Schneider MRN: [id number] Date of Birth: 1961-01-13 Referring Provider (PT): [name], MD   Encounter Date: 11/06/2020   PT End of Session - 11/06/20 0940    Visit Number 11    Date for PT Re-Evaluation 12/23/20    Authorization Type BCBS    PT Start Time 0935    PT Stop Time 1015    PT Time Calculation (min) 40 min    Activity Tolerance Patient tolerated treatment well    Behavior During Therapy [facility] for tasks assessed/performed           Past Medical History:  Diagnosis Date  . Anemia    IN HER 20'S  . Anginal pain (HCC)    LAST COUPLE DAYS  . Anxiety   . Asthma    CHILDHOOD  . Depression   . Headache    MIGRAINES  . Heart murmur    HAS BEEN TOLD  . History of stress test 2012  . Hypertension    CONTROLLED BY DIET    Past Surgical History:  Procedure Laterality Date  . DILATATION & CURETTAGE/HYSTEROSCOPY WITH MYOSURE N/A 08/25/2016   Procedure: DILATATION & CURETTAGE/HYSTEROSCOPY WITH MYOSURE;  Surgeon: Linda Hedges, DO;  Location: [facility];  Service: Gynecology;  Laterality: N/A;  . HYSTOSCOPY  2010  . INCISION AND DRAINAGE  1999    There were no vitals filed for this visit.   Subjective Assessment - 11/06/20 0937    Subjective The DN is wearing off and I feel tight again.  I have not been able to do my stretches due to family dynamics.    Limitations Standing;Walking    How long can you stand comfortably? 1 hour    How long can you walk comfortably? 1 hour    Diagnostic tests xrays - negative    Patient Stated Goals get rid of pain    Currently in Pain? Yes    Pain Score 7     Pain Location Back    Pain Orientation Right;Left;Lower    Pain Descriptors / Indicators Aching;Tightness    Pain Type Chronic pain    Pain Onset More than a  month ago    Pain Frequency Intermittent    Aggravating Factors  prolonged stand and walk    Pain Relieving Factors heat/meds/stretch                             [facility] Adult PT Treatment/Exercise - 11/06/20 0001      Exercises   Exercises Lumbar;Knee/Hip      Lumbar Exercises: Aerobic   Recumbent Bike L2 x 4' PT present to discuss status      Manual Therapy   Manual Therapy Myofascial release;Soft tissue mobilization;Joint mobilization    Joint Mobilization UPAs Lt L4-S1 and PAs Gr 2/3, thoracic PAs and ring sideglides T3-T10 Gr II-IV    Soft tissue mobilization thoracic and lumbar paraspinal broadening and stripping bil    Myofascial Release suction cup medial to lateral bil and manual fascial release medial to lateral lower thoracic and throughout lumbar spine            Trigger Point Dry Needling - 11/06/20 0001    Consent Given? Yes    Education Handout Provided Previously provided  Muscles Treated Back/Hip Lumbar multifidi;Gluteus medius    Dry Needling Comments glut med Lt, bil multifidi    Gluteus Medius Response Twitch response elicited;Palpable increased muscle length    Lumbar multifidi Response Palpable increased muscle length                  PT Short Term Goals - 08/28/20 0933      PT SHORT TERM GOAL #1   Title ind with initial HEP    Status Achieved      PT SHORT TERM GOAL #2   Title improve A/ROM for trunk to [facility] with min pain    Status Achieved      PT SHORT TERM GOAL #3   Title Pt will be able to perform sit to stand with proper body mechanics, core engagement without bracing hands on knees with min pain x 5 reps    Status Achieved      PT SHORT TERM GOAL #4   Title Improved bil hip flexibility to [facility] to reduce undue strain on spine.    Status Achieved      PT SHORT TERM GOAL #5   Title Reduced pain by at least 30% with daily walks.    Status Achieved             PT Long Term Goals - 09/30/20 0941      PT  LONG TERM GOAL #1   Title Pt will be ind with advanced HEP    Baseline needs more core in HEP    Time 12    Period Weeks    Status On-going    Target Date 12/23/20      PT LONG TERM GOAL #2   Title Pt will be able to perform all tranitional movements for bed mobility, car and chair transfers with min pain.    Status Achieved      PT LONG TERM GOAL #3   Title Pt will be able to tolerate standing tasks for cooking/cleaning x 20 min with min pain.    Time 12    Period Weeks    Status On-going    Target Date 12/23/20      PT LONG TERM GOAL #4   Title FOTO reduced to <= 52% to demo less limitation    Baseline 53% on 08/31/20    Status On-going    Target Date 12/23/20      PT LONG TERM GOAL #5   Title Pt will demo proper body mechanics and good stabilization of spine for bending/lifting to reduce chance of re-injury.    Status Achieved      Additional Long Term Goals   Additional Long Term Goals Yes      PT LONG TERM GOAL #6   Title Pt will demo improved gait stability with improved stance phase use of gluteals to reduce LBP with exercise walks.    Time 12    Period Weeks    Status New    Target Date 12/23/20                 Plan - 11/06/20 1131    Clinical Impression Statement Pt has return of higher levels of lumbar pain 7/10 which has increased slowly since last visit.  Pt admits this is likely due to not stretching as much with her daughter home from school.  PT revisited DN to bil lumbar multifidi with less reactivity today than with initial DN session.  PT added glut med on Lt  for DN which referred pain into Pt's hip and thigh with good release.  Pt continues to have signif myofascial restrictions along lumbar lordosis and lower thoracic spine which improves with suction cup and manual therapy fascial release.  PT increased time spent mobilizing around thoracic spine today with added release and elongation of tissues.  PT reiterated importance of compliance with HEP  to maintain gains between visits and establish consistent routines for pain reduction.  Continue along POC.    PT Frequency 2x / week    PT Duration 12 weeks    PT Treatment/Interventions ADLs/Self Care Home Management;Moist Heat;Cryotherapy;Electrical Stimulation;Neuromuscular re-education;Balance training;Therapeutic exercise;Therapeutic activities;Functional mobility training;Patient/family education;Manual techniques;Dry needling;Passive range of motion;Spinal Manipulations;Joint Manipulations    PT Next Visit Plan thoracic mobility, spinal ROM and stretching, hip stretching edge of mat table - give pic of this    PT Home Exercise Plan Access Code: [email or web address] + DN info    Consulted and Agree with Plan of Care Patient           Patient will benefit from skilled therapeutic intervention in order to improve the following deficits and impairments:     Visit Diagnosis: Chronic bilateral low back pain without sciatica  Pain in thoracic spine  Muscle weakness (generalized)  Cramp and spasm     Problem List There are no problems to display for this patient.   [name] Yuto Cajuste, PT 11/06/20 11:35 AM   [facility] [address] [place], [place], [address] Phone: [phone]   Fax:  [phone]  Name: Christi Heelan MRN: [id number] Date of Birth: Feb 14, 1961

## 2020-11-09 DIAGNOSIS — F411 Generalized anxiety disorder: Secondary | ICD-10-CM | POA: Diagnosis not present

## 2020-11-11 ENCOUNTER — Ambulatory Visit: Payer: BC Managed Care – PPO | Admitting: Physical Therapy

## 2020-11-12 DIAGNOSIS — F411 Generalized anxiety disorder: Secondary | ICD-10-CM | POA: Diagnosis not present

## 2020-11-13 ENCOUNTER — Ambulatory Visit: Payer: BC Managed Care – PPO | Admitting: Physical Therapy

## 2020-11-13 ENCOUNTER — Other Ambulatory Visit: Payer: Self-pay

## 2020-11-13 ENCOUNTER — Encounter: Payer: Self-pay | Admitting: Physical Therapy

## 2020-11-13 DIAGNOSIS — G8929 Other chronic pain: Secondary | ICD-10-CM

## 2020-11-13 DIAGNOSIS — M6281 Muscle weakness (generalized): Secondary | ICD-10-CM | POA: Diagnosis not present

## 2020-11-13 DIAGNOSIS — R252 Cramp and spasm: Secondary | ICD-10-CM | POA: Diagnosis not present

## 2020-11-13 DIAGNOSIS — M545 Low back pain, unspecified: Secondary | ICD-10-CM | POA: Diagnosis not present

## 2020-11-13 DIAGNOSIS — M546 Pain in thoracic spine: Secondary | ICD-10-CM | POA: Diagnosis not present

## 2020-11-13 NOTE — Therapy (Signed)
[facility] [address], [place], [place], [address] Phone: [phone]   Fax:  [phone]  Physical Therapy Treatment  Patient Details  Name: Taylor Chen MRN: [id number] Date of Birth: 1961/03/18 Referring Provider (PT): [name], MD   Encounter Date: 11/13/2020   PT End of Session - 11/13/20 1015    Visit Number 12    Date for PT Re-Evaluation 12/23/20    Authorization Type BCBS    PT Start Time 0937    PT Stop Time 1015    PT Time Calculation (min) 38 min    Activity Tolerance Patient tolerated treatment well    Behavior During Therapy [facility] for tasks assessed/performed           Past Medical History:  Diagnosis Date  . Anemia    IN HER 20'S  . Anginal pain (HCC)    LAST COUPLE DAYS  . Anxiety   . Asthma    CHILDHOOD  . Depression   . Headache    MIGRAINES  . Heart murmur    HAS BEEN TOLD  . History of stress test 2012  . Hypertension    CONTROLLED BY DIET    Past Surgical History:  Procedure Laterality Date  . DILATATION & CURETTAGE/HYSTEROSCOPY WITH MYOSURE N/A 08/25/2016   Procedure: DILATATION & CURETTAGE/HYSTEROSCOPY WITH MYOSURE;  Surgeon: Linda Hedges, DO;  Location: [facility];  Service: Gynecology;  Laterality: N/A;  . HYSTOSCOPY  2010  . INCISION AND DRAINAGE  1999    There were no vitals filed for this visit.   Subjective Assessment - 11/13/20 0940    Subjective I am not in pain but very sore - I stretch more and it makes me sore but helps the pain.    Limitations Standing;Walking    How long can you stand comfortably? 1 hour    How long can you walk comfortably? 1 hour    Diagnostic tests xrays - negative    Patient Stated Goals get rid of pain    Currently in Pain? Yes    Pain Score 7     Pain Location Back    Pain Orientation Right;Left;Lower    Pain Descriptors / Indicators Aching;Tightness    Pain Type Chronic pain    Pain Onset More than a month ago    Pain  Frequency Intermittent    Aggravating Factors  prolonged stand and walk    Pain Relieving Factors stretching, DN                             [facility] Adult PT Treatment/Exercise - 11/13/20 0001      Exercises   Exercises Lumbar;Knee/Hip      Lumbar Exercises: Stretches   Piriformis Stretch 2 reps;30 seconds    Piriformis Stretch Limitations edge of table    Other Lumbar Stretch Exercise prone quad stretch bil 1x60' passive by PT    Other Lumbar Stretch Exercise hip IR/ER passive ROM by PT 3x5 sec each bil      Lumbar Exercises: Quadruped   Madcat/Old Horse 10 reps    Other Quadruped Lumbar Exercises thoracic thread the needle, then thoracic rotation hand of back of head x 5 reps      Knee/Hip Exercises: Stretches   Hip Flexor Stretch Both;2 reps;20 seconds    Hip Flexor Stretch Limitations foot on 2nd step    Other Knee/Hip Stretches straddle stance hands to floor, back  straight, add thoracic rotation      Manual Therapy   Manual Therapy Soft tissue mobilization;Manual Traction    Soft tissue mobilization thoracic and lumbar paraspinal broadening and stripping bil    Manual Traction prone sacral distraction and lumbar distraction Gr II/III                    PT Short Term Goals - 08/28/20 0933      PT SHORT TERM GOAL #1   Title ind with initial HEP    Status Achieved      PT SHORT TERM GOAL #2   Title improve A/ROM for trunk to [facility] with min pain    Status Achieved      PT SHORT TERM GOAL #3   Title Pt will be able to perform sit to stand with proper body mechanics, core engagement without bracing hands on knees with min pain x 5 reps    Status Achieved      PT SHORT TERM GOAL #4   Title Improved bil hip flexibility to [facility] to reduce undue strain on spine.    Status Achieved      PT SHORT TERM GOAL #5   Title Reduced pain by at least 30% with daily walks.    Status Achieved             PT Long Term Goals - 09/30/20 0941      PT LONG  TERM GOAL #1   Title Pt will be ind with advanced HEP    Baseline needs more core in HEP    Time 12    Period Weeks    Status On-going    Target Date 12/23/20      PT LONG TERM GOAL #2   Title Pt will be able to perform all tranitional movements for bed mobility, car and chair transfers with min pain.    Status Achieved      PT LONG TERM GOAL #3   Title Pt will be able to tolerate standing tasks for cooking/cleaning x 20 min with min pain.    Time 12    Period Weeks    Status On-going    Target Date 12/23/20      PT LONG TERM GOAL #4   Title FOTO reduced to <= 52% to demo less limitation    Baseline 53% on 08/31/20    Status On-going    Target Date 12/23/20      PT LONG TERM GOAL #5   Title Pt will demo proper body mechanics and good stabilization of spine for bending/lifting to reduce chance of re-injury.    Status Achieved      Additional Long Term Goals   Additional Long Term Goals Yes      PT LONG TERM GOAL #6   Title Pt will demo improved gait stability with improved stance phase use of gluteals to reduce LBP with exercise walks.    Time 12    Period Weeks    Status New    Target Date 12/23/20                 Plan - 11/13/20 1015    Clinical Impression Statement Pt reports more soreness than pain.  She thinks she gets sore from stretching. PT encouraged her to ease into stretches and not push end range initially.  Pt presented with much improved fascial mobility and soft tissue tension.  She benefits from thoracic ROM and LE stretches to reduce  overloading of lumbar spine.  Trigger points in bil lumbar multifidi have much improved since DN was performed.  Continue along POC for manual PT and ongoing flexbility and mobility.    PT Frequency 2x / week    PT Duration 12 weeks    PT Treatment/Interventions ADLs/Self Care Home Management;Moist Heat;Cryotherapy;Electrical Stimulation;Neuromuscular re-education;Balance training;Therapeutic exercise;Therapeutic  activities;Functional mobility training;Patient/family education;Manual techniques;Dry needling;Passive range of motion;Spinal Manipulations;Joint Manipulations    PT Next Visit Plan thoracic mobility, spinal ROM and stretching, hip stretching edge of mat table - give pic of this    PT Home Exercise Plan Access Code: [email or web address] + DN info    Consulted and Agree with Plan of Care Patient           Patient will benefit from skilled therapeutic intervention in order to improve the following deficits and impairments:     Visit Diagnosis: Chronic bilateral low back pain without sciatica  Pain in thoracic spine  Muscle weakness (generalized)     Problem List There are no problems to display for this patient.   [place] 11/13/2020, 10:18 AM  [facility] [address] [place], [place], [address] Phone: [phone]   Fax:  [phone]  Name: Laycie Schriner MRN: [id number] Date of Birth: 1960/10/27

## 2020-11-16 DIAGNOSIS — F411 Generalized anxiety disorder: Secondary | ICD-10-CM | POA: Diagnosis not present

## 2020-11-17 DIAGNOSIS — S0502XA Injury of conjunctiva and corneal abrasion without foreign body, left eye, initial encounter: Secondary | ICD-10-CM | POA: Diagnosis not present

## 2020-11-18 ENCOUNTER — Other Ambulatory Visit: Payer: Self-pay

## 2020-11-18 ENCOUNTER — Encounter: Payer: Self-pay | Admitting: Physical Therapy

## 2020-11-18 ENCOUNTER — Ambulatory Visit: Payer: BC Managed Care – PPO | Attending: Family Medicine | Admitting: Physical Therapy

## 2020-11-18 DIAGNOSIS — R252 Cramp and spasm: Secondary | ICD-10-CM | POA: Diagnosis not present

## 2020-11-18 DIAGNOSIS — M545 Low back pain, unspecified: Secondary | ICD-10-CM | POA: Insufficient documentation

## 2020-11-18 DIAGNOSIS — M546 Pain in thoracic spine: Secondary | ICD-10-CM

## 2020-11-18 DIAGNOSIS — M6281 Muscle weakness (generalized): Secondary | ICD-10-CM

## 2020-11-18 DIAGNOSIS — G8929 Other chronic pain: Secondary | ICD-10-CM

## 2020-11-18 NOTE — Therapy (Signed)
[facility] [address], [place], [place], [address] Phone: [phone]   Fax:  [phone]  Physical Therapy Treatment  Patient Details  Name: Taylor Chen MRN: [id number] Date of Birth: 1961-08-23 Referring Provider (PT): [name], MD   Encounter Date: 11/18/2020   PT End of Session - 11/18/20 0850    Visit Number 13    Date for PT Re-Evaluation 12/23/20    Authorization Type BCBS    PT Start Time 0850    PT Stop Time 0928    PT Time Calculation (min) 38 min    Activity Tolerance Patient tolerated treatment well    Behavior During Therapy [facility] for tasks assessed/performed           Past Medical History:  Diagnosis Date  . Anemia    IN HER 20'S  . Anginal pain (HCC)    LAST COUPLE DAYS  . Anxiety   . Asthma    CHILDHOOD  . Depression   . Headache    MIGRAINES  . Heart murmur    HAS BEEN TOLD  . History of stress test 2012  . Hypertension    CONTROLLED BY DIET    Past Surgical History:  Procedure Laterality Date  . DILATATION & CURETTAGE/HYSTEROSCOPY WITH MYOSURE N/A 08/25/2016   Procedure: DILATATION & CURETTAGE/HYSTEROSCOPY WITH MYOSURE;  Surgeon: Linda Hedges, DO;  Location: [facility];  Service: Gynecology;  Laterality: N/A;  . HYSTOSCOPY  2010  . INCISION AND DRAINAGE  1999    There were no vitals filed for this visit.   Subjective Assessment - 11/18/20 0850    Subjective I really don't have pain anymore, just soreness from stretching.  This is such a relief to be sore vs in pain    Pertinent History fell on coccyx yesterday down the stairs    Limitations Standing;Walking    How long can you stand comfortably? 1 hour    How long can you walk comfortably? 1 hour    Diagnostic tests xrays - negative    Patient Stated Goals get rid of pain    Currently in Pain? Yes    Pain Score 0-No pain   just sore   Pain Location Back    Pain Orientation Right;Left;Lower    Pain Descriptors /  Indicators Sore    Pain Type Chronic pain    Pain Onset More than a month ago    Pain Frequency Intermittent    Aggravating Factors  prolonged stand and walk    Pain Relieving Factors stretching, DN                             [facility] Adult PT Treatment/Exercise - 11/18/20 0001      Exercises   Exercises Lumbar;Knee/Hip;Shoulder      Lumbar Exercises: Stretches   Active Hamstring Stretch Left;Right;1 rep;30 seconds    Active Hamstring Stretch Limitations add adductor stretch, supine with strap    Lower Trunk Rotation 3 reps;10 seconds    Quad Stretch Left;Right;30 seconds;1 rep    Sports administrator Limitations standing    Piriformis Stretch 2 reps;30 seconds    Piriformis Stretch Limitations edge of table      Lumbar Exercises: Aerobic   Recumbent Bike L2 x 5' PT present to discuss status and HEP      Lumbar Exercises: Sidelying   Other Sidelying Lumbar Exercises open books x [address]  Lumbar Exercises: Quadruped   Madcat/Old Horse 10 reps    Other Quadruped Lumbar Exercises thoracic thread the needle, then thoracic rotation hand of back of head x 5 reps    Other Quadruped Lumbar Exercises prayer x 20 sec      Manual Therapy   Manual Therapy Soft tissue mobilization;Manual Traction    Soft tissue mobilization thoracic and lumbar paraspinal broadening and stripping bil    Manual Traction prone sacral distraction and lumbar distraction Gr II/III                    PT Short Term Goals - 08/28/20 0933      PT SHORT TERM GOAL #1   Title ind with initial HEP    Status Achieved      PT SHORT TERM GOAL #2   Title improve A/ROM for trunk to [facility] with min pain    Status Achieved      PT SHORT TERM GOAL #3   Title Pt will be able to perform sit to stand with proper body mechanics, core engagement without bracing hands on knees with min pain x 5 reps    Status Achieved      PT SHORT TERM GOAL #4   Title Improved bil hip flexibility to [facility] to reduce  undue strain on spine.    Status Achieved      PT SHORT TERM GOAL #5   Title Reduced pain by at least 30% with daily walks.    Status Achieved             PT Long Term Goals - 09/30/20 0941      PT LONG TERM GOAL #1   Title Pt will be ind with advanced HEP    Baseline needs more core in HEP    Time 12    Period Weeks    Status On-going    Target Date 12/23/20      PT LONG TERM GOAL #2   Title Pt will be able to perform all tranitional movements for bed mobility, car and chair transfers with min pain.    Status Achieved      PT LONG TERM GOAL #3   Title Pt will be able to tolerate standing tasks for cooking/cleaning x 20 min with min pain.    Time 12    Period Weeks    Status On-going    Target Date 12/23/20      PT LONG TERM GOAL #4   Title FOTO reduced to <= 52% to demo less limitation    Baseline 53% on 08/31/20    Status On-going    Target Date 12/23/20      PT LONG TERM GOAL #5   Title Pt will demo proper body mechanics and good stabilization of spine for bending/lifting to reduce chance of re-injury.    Status Achieved      Additional Long Term Goals   Additional Long Term Goals Yes      PT LONG TERM GOAL #6   Title Pt will demo improved gait stability with improved stance phase use of gluteals to reduce LBP with exercise walks.    Time 12    Period Weeks    Status New    Target Date 12/23/20                 Plan - 11/18/20 0910    Clinical Impression Statement Pt with change in pain from pain to just soreness over last  2 visits.  She feels much improvement in pain experience and demos much improved soft tissue extensibility and ROM especially in thoracic rotation bil.  Pt continues to be compliant with home stretches.  She benefits from a combo of stretching and manual techniques. Continue along POC.    Rehab Potential Excellent    PT Frequency 2x / week    PT Duration 12 weeks    PT Treatment/Interventions ADLs/Self Care Home Management;Moist  Heat;Cryotherapy;Electrical Stimulation;Neuromuscular re-education;Balance training;Therapeutic exercise;Therapeutic activities;Functional mobility training;Patient/family education;Manual techniques;Dry needling;Passive range of motion;Spinal Manipulations;Joint Manipulations    PT Next Visit Plan thoracic mobility, spinal ROM and stretching, hip stretching edge of mat table - give pic of this    PT Home Exercise Plan Access Code: [email or web address] + DN info    Consulted and Agree with Plan of Care Patient           Patient will benefit from skilled therapeutic intervention in order to improve the following deficits and impairments:     Visit Diagnosis: Chronic bilateral low back pain without sciatica  Pain in thoracic spine  Muscle weakness (generalized)  Cramp and spasm     Problem List There are no problems to display for this patient.   Taylor Chen 11/18/2020, 9:26 AM  [facility] [address] [place], [place], [address] Phone: [phone]   Fax:  [phone]  Name: Taylor Chen MRN: [id number] Date of Birth: November 14, 1960

## 2020-11-19 DIAGNOSIS — F411 Generalized anxiety disorder: Secondary | ICD-10-CM | POA: Diagnosis not present

## 2020-11-20 ENCOUNTER — Encounter: Payer: Self-pay | Admitting: Physical Therapy

## 2020-11-20 ENCOUNTER — Ambulatory Visit: Payer: BC Managed Care – PPO | Admitting: Physical Therapy

## 2020-11-20 ENCOUNTER — Other Ambulatory Visit: Payer: Self-pay

## 2020-11-20 DIAGNOSIS — G8929 Other chronic pain: Secondary | ICD-10-CM | POA: Diagnosis not present

## 2020-11-20 DIAGNOSIS — M546 Pain in thoracic spine: Secondary | ICD-10-CM

## 2020-11-20 DIAGNOSIS — M545 Low back pain, unspecified: Secondary | ICD-10-CM

## 2020-11-20 DIAGNOSIS — M6281 Muscle weakness (generalized): Secondary | ICD-10-CM | POA: Diagnosis not present

## 2020-11-20 DIAGNOSIS — R252 Cramp and spasm: Secondary | ICD-10-CM | POA: Diagnosis not present

## 2020-11-20 NOTE — Therapy (Signed)
[facility] [address], [place], [place], [address] Phone: [phone]   Fax:  [phone]  Physical Therapy Treatment  Patient Details  Name: Taylor Chen MRN: [id number] Date of Birth: Apr 18, 1961 Referring Provider (PT): [name], MD   Encounter Date: 11/20/2020   PT End of Session - 11/20/20 0848    Visit Number 14    Date for PT Re-Evaluation 12/23/20    Authorization Type BCBS    PT Start Time 0845    PT Stop Time 0930    PT Time Calculation (min) 45 min    Activity Tolerance Patient tolerated treatment well    Behavior During Therapy [facility] for tasks assessed/performed           Past Medical History:  Diagnosis Date  . Anemia    IN HER 20'S  . Anginal pain (HCC)    LAST COUPLE DAYS  . Anxiety   . Asthma    CHILDHOOD  . Depression   . Headache    MIGRAINES  . Heart murmur    HAS BEEN TOLD  . History of stress test 2012  . Hypertension    CONTROLLED BY DIET    Past Surgical History:  Procedure Laterality Date  . DILATATION & CURETTAGE/HYSTEROSCOPY WITH MYOSURE N/A 08/25/2016   Procedure: DILATATION & CURETTAGE/HYSTEROSCOPY WITH MYOSURE;  Surgeon: Linda Hedges, DO;  Location: [facility];  Service: Gynecology;  Laterality: N/A;  . HYSTOSCOPY  2010  . INCISION AND DRAINAGE  1999    There were no vitals filed for this visit.   Subjective Assessment - 11/20/20 0847    Subjective Continued improvement in pain.  Just sore.  I feel so much better.    Pertinent History fell on coccyx yesterday down the stairs    How long can you stand comfortably? 1 hour    How long can you walk comfortably? 1 hour    Diagnostic tests xrays - negative    Patient Stated Goals get rid of pain                             [facility] Adult PT Treatment/Exercise - 11/20/20 0001      Lumbar Exercises: Stretches   Active Hamstring Stretch Left;Right;1 rep;30 seconds    Active Hamstring Stretch  Limitations add adductor stretch, supine with strap    Lower Trunk Rotation 3 reps;10 seconds    Piriformis Stretch 1 rep;30 seconds    Piriformis Stretch Limitations supine    Figure 4 Stretch 1 rep    Figure 4 Stretch Limitations bil    Other Lumbar Stretch Exercise open books 3x each way hold for duration of deep inhale      Lumbar Exercises: Aerobic   Recumbent Bike L2 x 5' PT present to review HEP and plan for d/c within a few visits      Lumbar Exercises: Standing   Other Standing Lumbar Exercises counter plank 2x20 sec, add 5x each knee flexion to opp shoulder, hip ext and hip abd bil    Other Standing Lumbar Exercises standing cable chops 20lb x 10 bil      Lumbar Exercises: Quadruped   Madcat/Old Horse 10 reps    Other Quadruped Lumbar Exercises thoracic thread the needle, then thoracic rotation hand of back of head x 5 reps    Other Quadruped Lumbar Exercises prayer x 20 sec      Knee/Hip Exercises: Stretches  Piriformis Stretch Both;1 rep;30 seconds                  PT Education - 11/20/20 0852    Education Details Access Code: [email or web address]    Person(s) Educated Patient    Methods Explanation;Handout;Demonstration    Comprehension Verbalized understanding            PT Short Term Goals - 08/28/20 0933      PT SHORT TERM GOAL #1   Title ind with initial HEP    Status Achieved      PT SHORT TERM GOAL #2   Title improve A/ROM for trunk to [facility] with min pain    Status Achieved      PT SHORT TERM GOAL #3   Title Pt will be able to perform sit to stand with proper body mechanics, core engagement without bracing hands on knees with min pain x 5 reps    Status Achieved      PT SHORT TERM GOAL #4   Title Improved bil hip flexibility to [facility] to reduce undue strain on spine.    Status Achieved      PT SHORT TERM GOAL #5   Title Reduced pain by at least 30% with daily walks.    Status Achieved             PT Long Term Goals - 09/30/20 0941      PT  LONG TERM GOAL #1   Title Pt will be ind with advanced HEP    Baseline needs more core in HEP    Time 12    Period Weeks    Status On-going    Target Date 12/23/20      PT LONG TERM GOAL #2   Title Pt will be able to perform all tranitional movements for bed mobility, car and chair transfers with min pain.    Status Achieved      PT LONG TERM GOAL #3   Title Pt will be able to tolerate standing tasks for cooking/cleaning x 20 min with min pain.    Time 12    Period Weeks    Status On-going    Target Date 12/23/20      PT LONG TERM GOAL #4   Title FOTO reduced to <= 52% to demo less limitation    Baseline 53% on 08/31/20    Status On-going    Target Date 12/23/20      PT LONG TERM GOAL #5   Title Pt will demo proper body mechanics and good stabilization of spine for bending/lifting to reduce chance of re-injury.    Status Achieved      Additional Long Term Goals   Additional Long Term Goals Yes      PT LONG TERM GOAL #6   Title Pt will demo improved gait stability with improved stance phase use of gluteals to reduce LBP with exercise walks.    Time 12    Period Weeks    Status New    Target Date 12/23/20                 Plan - 11/20/20 0905    Clinical Impression Statement Pt continues to have rounded the corner with good pain relief and control using HEP stretches more consistently.  PT ensured she had complete handout list for HEP.  Pt was able to demo counter plank series with added LE pulses for hip ext, abd and flexion with good control and form.  PT reviewed supine and SL SLR which Pt was doing with speed vs control - PT cued to slow for trunk control with Pt realizing far more challenge with this.  Pt will continue to benefit from skilled PT to round out core stabilization alongside her HEP stretches and ROM.    PT Frequency 2x / week    PT Duration 12 weeks    PT Treatment/Interventions ADLs/Self Care Home Management;Moist Heat;Cryotherapy;Electrical  Stimulation;Neuromuscular re-education;Balance training;Therapeutic exercise;Therapeutic activities;Functional mobility training;Patient/family education;Manual techniques;Dry needling;Passive range of motion;Spinal Manipulations;Joint Manipulations    PT Next Visit Plan thoracic mobility, spinal ROM and stretching, hip stretching edge of mat table - give pic of this    PT Home Exercise Plan Access Code: [email or web address] + DN info    Consulted and Agree with Plan of Care Patient           Patient will benefit from skilled therapeutic intervention in order to improve the following deficits and impairments:     Visit Diagnosis: Chronic bilateral low back pain without sciatica  Pain in thoracic spine  Muscle weakness (generalized)     Problem List There are no problems to display for this patient.   [place] 11/20/2020, 9:34 AM  [facility] [address], [place], [place], [address] Phone: [phone]   Fax:  [phone]  Name: Taylor Chen MRN: [id number] Date of Birth: 07-04-1961

## 2020-11-20 NOTE — Patient Instructions (Signed)
Access Code: [email or web address] URL: [email or web address] Date: 11/20/2020 Prepared by: [name] Cortnie Ringel  Exercises Hooklying Single Knee to Chest Stretch - 2 x daily - 7 x weekly - 3 sets - 5 reps - 10 hold Supine Posterior Pelvic Tilt - 2 x daily - 7 x weekly - 2 sets - 10 reps Supine Piriformis Stretch with Foot on Ground - 2 x daily - 7 x weekly - 1 sets - 2 reps - 30 hold Supine Figure 4 Piriformis Stretch - 2 x daily - 7 x weekly - 1 sets - 2 reps - 30 hold Child's Pose Stretch - 1 x daily - 7 x weekly - 1 sets - 3 reps - 10 hold Cat-Camel - 1 x daily - 7 x weekly - 1 sets - 10 reps Dead Bug - 1 x daily - 7 x weekly - 2 sets - 10 reps Hooklying Isometric Hip Flexion - 1 x daily - 7 x weekly - 1 sets - 5 reps - 5 hold Bridge - 1 x daily - 7 x weekly - 2 sets - 10 reps Quadruped Thoracic Rotation - Reach Under - 1 x daily - 7 x weekly - 1 sets - 3 reps - 5 hold Quadruped Thoracic Rotation Full Range with Hand on Neck - 1 x daily - 7 x weekly - 1 sets - 5 reps Sidelying Thoracic Rotation with Open Book - 1 x daily - 7 x weekly - 3 sets - 10 reps Supine Lower Trunk Rotation - 1 x daily - 7 x weekly - 1 sets - 3 reps - 10 hold

## 2020-11-23 DIAGNOSIS — F411 Generalized anxiety disorder: Secondary | ICD-10-CM | POA: Diagnosis not present

## 2020-11-25 ENCOUNTER — Other Ambulatory Visit: Payer: Self-pay

## 2020-11-25 ENCOUNTER — Encounter: Payer: Self-pay | Admitting: Physical Therapy

## 2020-11-25 ENCOUNTER — Ambulatory Visit: Payer: BC Managed Care – PPO | Admitting: Physical Therapy

## 2020-11-25 DIAGNOSIS — M546 Pain in thoracic spine: Secondary | ICD-10-CM

## 2020-11-25 DIAGNOSIS — G8929 Other chronic pain: Secondary | ICD-10-CM | POA: Diagnosis not present

## 2020-11-25 DIAGNOSIS — M6281 Muscle weakness (generalized): Secondary | ICD-10-CM

## 2020-11-25 DIAGNOSIS — R252 Cramp and spasm: Secondary | ICD-10-CM | POA: Diagnosis not present

## 2020-11-25 DIAGNOSIS — M545 Low back pain, unspecified: Secondary | ICD-10-CM | POA: Diagnosis not present

## 2020-11-25 NOTE — Therapy (Signed)
[facility] [address], [place], [place], [address] Phone: [phone]   Fax:  [phone]  Physical Therapy Treatment  Patient Details  Name: Taylor Chen MRN: [id number] Date of Birth: [birth year] Referring Provider (PT): [name], MD   Encounter Date: 11/25/2020   PT End of Session - 11/25/20 0858    Visit Number 15    Date for PT Re-Evaluation 12/23/20    Authorization Type BCBS    PT Start Time 2585   Pt late   PT Stop Time 0932    PT Time Calculation (min) 35 min    Activity Tolerance Patient tolerated treatment well    Behavior During Therapy [facility] for tasks assessed/performed           Past Medical History:  Diagnosis Date  . Anemia    IN HER 20'S  . Anginal pain (HCC)    LAST COUPLE DAYS  . Anxiety   . Asthma    CHILDHOOD  . Depression   . Headache    MIGRAINES  . Heart murmur    HAS BEEN TOLD  . History of stress test 2012  . Hypertension    CONTROLLED BY DIET    Past Surgical History:  Procedure Laterality Date  . DILATATION & CURETTAGE/HYSTEROSCOPY WITH MYOSURE N/A 08/25/2016   Procedure: DILATATION & CURETTAGE/HYSTEROSCOPY WITH MYOSURE;  Surgeon: Linda Hedges, DO;  Location: [facility];  Service: Gynecology;  Laterality: N/A;  . HYSTOSCOPY  2010  . INCISION AND DRAINAGE  1999    There were no vitals filed for this visit.   Subjective Assessment - 11/25/20 0859    Subjective If I don't stretch I have a lot of pain.  I'm still much better but realize how dependent on I am stretching for pain relief.    Limitations Standing;Walking    How long can you stand comfortably? 1 hour    How long can you walk comfortably? 1 hour    Diagnostic tests xrays - negative    Patient Stated Goals get rid of pain    Currently in Pain? No/denies    Pain Score 2     Pain Location Back    Pain Orientation Right;Left    Pain Descriptors / Indicators Sore    Pain Type Chronic pain    Pain Onset  More than a month ago    Pain Frequency Intermittent    Aggravating Factors  not stretching                             [facility] Adult PT Treatment/Exercise - 11/25/20 0001      Exercises   Exercises Lumbar;Knee/Hip;Shoulder      Lumbar Exercises: Stretches   Active Hamstring Stretch Left;Right;1 rep;30 seconds    Active Hamstring Stretch Limitations add adductor stretch, supine with strap    Lower Trunk Rotation 3 reps;10 seconds    Piriformis Stretch 1 rep;30 seconds    Piriformis Stretch Limitations edge of table    Figure 4 Stretch 1 rep    Figure 4 Stretch Limitations bil    Other Lumbar Stretch Exercise open books 3x each way hold for duration of deep inhale    Other Lumbar Stretch Exercise seated green ball rollouts 3-way 5 sec hold x 3 each      Lumbar Exercises: Aerobic   Recumbent Bike L2 x 5' PT present to review HEP and plan for d/c within a  few visits      Lumbar Exercises: Standing   Other Standing Lumbar Exercises counter plank 3x10 sec, add 5x each knee flexion to opp shoulder, hip ext and hip abd bil    Other Standing Lumbar Exercises standing cable chops 20lb x 10 bil      Lumbar Exercises: Seated   Other Seated Lumbar Exercises on balance pad and green ball march x 10 each      Lumbar Exercises: Quadruped   Madcat/Old Horse 10 reps    Other Quadruped Lumbar Exercises thoracic thread the needle, then thoracic rotation hand of back of head x 5 reps    Other Quadruped Lumbar Exercises prayer x 20 sec      Manual Therapy   Soft tissue mobilization Lt lumbar and paraspinal stripping                    PT Short Term Goals - 08/28/20 0933      PT SHORT TERM GOAL #1   Title ind with initial HEP    Status Achieved      PT SHORT TERM GOAL #2   Title improve A/ROM for trunk to [facility] with min pain    Status Achieved      PT SHORT TERM GOAL #3   Title Pt will be able to perform sit to stand with proper body mechanics, core engagement  without bracing hands on knees with min pain x 5 reps    Status Achieved      PT SHORT TERM GOAL #4   Title Improved bil hip flexibility to [facility] to reduce undue strain on spine.    Status Achieved      PT SHORT TERM GOAL #5   Title Reduced pain by at least 30% with daily walks.    Status Achieved             PT Long Term Goals - 09/30/20 0941      PT LONG TERM GOAL #1   Title Pt will be ind with advanced HEP    Baseline needs more core in HEP    Time 12    Period Weeks    Status On-going    Target Date 12/23/20      PT LONG TERM GOAL #2   Title Pt will be able to perform all tranitional movements for bed mobility, car and chair transfers with min pain.    Status Achieved      PT LONG TERM GOAL #3   Title Pt will be able to tolerate standing tasks for cooking/cleaning x 20 min with min pain.    Time 12    Period Weeks    Status On-going    Target Date 12/23/20      PT LONG TERM GOAL #4   Title FOTO reduced to <= 52% to demo less limitation    Baseline 53% on 08/31/20    Status On-going    Target Date 12/23/20      PT LONG TERM GOAL #5   Title Pt will demo proper body mechanics and good stabilization of spine for bending/lifting to reduce chance of re-injury.    Status Achieved      Additional Long Term Goals   Additional Long Term Goals Yes      PT LONG TERM GOAL #6   Title Pt will demo improved gait stability with improved stance phase use of gluteals to reduce LBP with exercise walks.    Time 12  Period Weeks    Status New    Target Date 12/23/20                 Plan - 11/25/20 0901    Clinical Impression Statement Pt reports ongoing improved pain relief especially with compliance to HEP, better serving her if she stretches earlier in the day than later.  PT has initiated doing some light dynamic core with overlay of LE which Pt performs with good form and tolerance.  She has much improved soft tissue mobility across posterior trunk and improved LE  flexibility bil.  She is getting a ballet bar to continue with some standing ther ex upon d/c from PT.    PT Frequency 2x / week    PT Duration 12 weeks    PT Treatment/Interventions ADLs/Self Care Home Management;Moist Heat;Cryotherapy;Electrical Stimulation;Neuromuscular re-education;Balance training;Therapeutic exercise;Therapeutic activities;Functional mobility training;Patient/family education;Manual techniques;Dry needling;Passive range of motion;Spinal Manipulations;Joint Manipulations    PT Next Visit Plan thoracic mobility, spinal ROM and stretching, hip stretching edge of mat table    PT Home Exercise Plan Access Code: [email or web address] + DN info    Consulted and Agree with Plan of Care Patient           Patient will benefit from skilled therapeutic intervention in order to improve the following deficits and impairments:     Visit Diagnosis: Chronic bilateral low back pain without sciatica  Pain in thoracic spine  Muscle weakness (generalized)     Problem List There are no problems to display for this patient.   [name] E Nicki Gracy 11/25/2020, 9:32 AM  [facility] [address] [place], [place], [address] Phone: [phone]   Fax:  [phone]  Name: Candia Kingsbury MRN: [id number] Date of Birth: 08-Apr-1961

## 2020-11-26 DIAGNOSIS — F411 Generalized anxiety disorder: Secondary | ICD-10-CM | POA: Diagnosis not present

## 2020-11-27 ENCOUNTER — Other Ambulatory Visit: Payer: Self-pay

## 2020-11-27 ENCOUNTER — Encounter: Payer: Self-pay | Admitting: Physical Therapy

## 2020-11-27 ENCOUNTER — Ambulatory Visit: Payer: BC Managed Care – PPO | Admitting: Physical Therapy

## 2020-11-27 DIAGNOSIS — M6281 Muscle weakness (generalized): Secondary | ICD-10-CM | POA: Diagnosis not present

## 2020-11-27 DIAGNOSIS — G8929 Other chronic pain: Secondary | ICD-10-CM

## 2020-11-27 DIAGNOSIS — M545 Low back pain, unspecified: Secondary | ICD-10-CM | POA: Diagnosis not present

## 2020-11-27 DIAGNOSIS — M546 Pain in thoracic spine: Secondary | ICD-10-CM | POA: Diagnosis not present

## 2020-11-27 DIAGNOSIS — R252 Cramp and spasm: Secondary | ICD-10-CM | POA: Diagnosis not present

## 2020-11-27 NOTE — Therapy (Signed)
[facility] [address], [place], [place], [address] Phone: [phone]   Fax:  [phone]  Physical Therapy Treatment  Patient Details  Name: Taylor Chen MRN: [id number] Date of Birth: 1961-08-24 Referring Provider (PT): [name], MD   Encounter Date: 11/27/2020   PT End of Session - 11/27/20 0852    Visit Number 16    Date for PT Re-Evaluation 12/23/20    Authorization Type BCBS    PT Start Time [phone]    PT Stop Time 0930    PT Time Calculation (min) 38 min    Activity Tolerance Patient tolerated treatment well    Behavior During Therapy [facility] for tasks assessed/performed           Past Medical History:  Diagnosis Date  . Anemia    IN HER 20'S  . Anginal pain (HCC)    LAST COUPLE DAYS  . Anxiety   . Asthma    CHILDHOOD  . Depression   . Headache    MIGRAINES  . Heart murmur    HAS BEEN TOLD  . History of stress test 2012  . Hypertension    CONTROLLED BY DIET    Past Surgical History:  Procedure Laterality Date  . DILATATION & CURETTAGE/HYSTEROSCOPY WITH MYOSURE N/A 08/25/2016   Procedure: DILATATION & CURETTAGE/HYSTEROSCOPY WITH MYOSURE;  Surgeon: Linda Hedges, DO;  Location: [facility];  Service: Gynecology;  Laterality: N/A;  . HYSTOSCOPY  2010  . INCISION AND DRAINAGE  1999    There were no vitals filed for this visit.   Subjective Assessment - 11/27/20 0856    Subjective I feel so much better especially when I'm good about stretching.  I am 100% better.  I am getting my ballet bar today to help me keep exercising.    Pertinent History fell on coccyx yesterday down the stairs    Limitations Standing;Walking    How long can you stand comfortably? 1 hour    How long can you walk comfortably? 1 hour    Diagnostic tests xrays - negative    Patient Stated Goals get rid of pain    Currently in Pain? No/denies              [facility] PT Assessment - 11/27/20 0001      Assessment    Medical Diagnosis M54.5 (ICD-10-CM) - Low back pain    Referring Provider (PT) [name], MD    Onset Date/Surgical Date --   04/19/2019   Next MD Visit no    Prior Therapy yes for back pain in past                         [facility] Adult PT Treatment/Exercise - 11/27/20 0001      Exercises   Exercises Lumbar;Knee/Hip;Shoulder      Lumbar Exercises: Stretches   Active Hamstring Stretch Left;Right;1 rep;30 seconds    Active Hamstring Stretch Limitations add adductor stretch, supine with strap    Lower Trunk Rotation 3 reps;10 seconds    Piriformis Stretch 1 rep;30 seconds    Piriformis Stretch Limitations edge of table    Figure 4 Stretch 1 rep    Figure 4 Stretch Limitations bil    Other Lumbar Stretch Exercise open books 3x each way hold for duration of deep inhale    Other Lumbar Stretch Exercise seated green ball rollouts 3-way 5 sec hold x 3 each  Lumbar Exercises: Aerobic   Recumbent Bike L2 x 5' PT present to review HEP and plan for d/c within a few visits      Lumbar Exercises: Standing   Other Standing Lumbar Exercises counter plank 3x10 sec, add 5x each knee flexion to opp shoulder, hip ext and hip abd bil    Other Standing Lumbar Exercises standing cable chops 20lb x 10 bil      Lumbar Exercises: Seated   Other Seated Lumbar Exercises balance pad march slow x 20      Lumbar Exercises: Quadruped   Madcat/Old Horse 10 reps    Other Quadruped Lumbar Exercises thoracic thread the needle, then thoracic rotation hand of back of head x 5 reps    Other Quadruped Lumbar Exercises prayer x 20 sec      Knee/Hip Exercises: Supine   Straight Leg Raises Strengthening;Both;10 reps    Straight Leg Raise with External Rotation Strengthening;Both;10 reps      Knee/Hip Exercises: Sidelying   Hip ABduction Strengthening;Both;10 reps                    PT Short Term Goals - 08/28/20 0933      PT SHORT TERM GOAL #1   Title ind with initial HEP     Status Achieved      PT SHORT TERM GOAL #2   Title improve A/ROM for trunk to [facility] with min pain    Status Achieved      PT SHORT TERM GOAL #3   Title Pt will be able to perform sit to stand with proper body mechanics, core engagement without bracing hands on knees with min pain x 5 reps    Status Achieved      PT SHORT TERM GOAL #4   Title Improved bil hip flexibility to [facility] to reduce undue strain on spine.    Status Achieved      PT SHORT TERM GOAL #5   Title Reduced pain by at least 30% with daily walks.    Status Achieved             PT Long Term Goals - 11/27/20 7425      PT LONG TERM GOAL #1   Title Pt will be ind with advanced HEP    Status Achieved      PT LONG TERM GOAL #2   Title Pt will be able to perform all tranitional movements for bed mobility, car and chair transfers with min pain.    Status Achieved      PT LONG TERM GOAL #3   Title Pt will be able to tolerate standing tasks for cooking/cleaning x 20 min with min pain.    Status Achieved      PT LONG TERM GOAL #4   Title FOTO reduced to <= 52% to demo less limitation    Baseline 29%    Status Achieved      PT LONG TERM GOAL #5   Title Pt will demo proper body mechanics and good stabilization of spine for bending/lifting to reduce chance of re-injury.    Status Achieved      PT LONG TERM GOAL #6   Title Pt will demo improved gait stability with improved stance phase use of gluteals to reduce LBP with exercise walks.    Status Achieved                 Plan - 11/27/20 0911    Clinical Impression Statement Pt  has met all LTGs and reports feeling 100% better. She demos much improved soft tissue extensibility along trunk with improved trunk ROM for functional bending and rotation.  FOTO score with signif improvement down to 29% demo'ing less limitation.  Pt has been more compliant with LE and trunk stretching daily and has HEP for deep core target.  She is ready to d/c to HEP.            Patient will benefit from skilled therapeutic intervention in order to improve the following deficits and impairments:     Visit Diagnosis: Chronic bilateral low back pain without sciatica  Pain in thoracic spine  Muscle weakness (generalized)     Problem List There are no problems to display for this patient.  PHYSICAL THERAPY DISCHARGE SUMMARY  Visits from Start of Care: 16  Current functional level related to goals / functional outcomes: See above   Remaining deficits: See above   Education / Equipment: HEP Plan: Patient agrees to discharge.  Patient goals were met. Patient is being discharged due to meeting the stated rehab goals.  ?????       Taylor Chen 11/27/2020, 9:19 AM  [facility] [address], [place], [place], [address] Phone: [phone]   Fax:  [phone]  Name: Taylor Chen MRN: [id number] Date of Birth: January 17, 1961

## 2020-11-30 DIAGNOSIS — F411 Generalized anxiety disorder: Secondary | ICD-10-CM | POA: Diagnosis not present

## 2020-12-01 DIAGNOSIS — H16143 Punctate keratitis, bilateral: Secondary | ICD-10-CM | POA: Diagnosis not present

## 2020-12-01 DIAGNOSIS — H40023 Open angle with borderline findings, high risk, bilateral: Secondary | ICD-10-CM | POA: Diagnosis not present

## 2020-12-03 DIAGNOSIS — F411 Generalized anxiety disorder: Secondary | ICD-10-CM | POA: Diagnosis not present

## 2020-12-07 DIAGNOSIS — F411 Generalized anxiety disorder: Secondary | ICD-10-CM | POA: Diagnosis not present

## 2020-12-10 DIAGNOSIS — F411 Generalized anxiety disorder: Secondary | ICD-10-CM | POA: Diagnosis not present

## 2020-12-17 DIAGNOSIS — F411 Generalized anxiety disorder: Secondary | ICD-10-CM | POA: Diagnosis not present

## 2020-12-21 DIAGNOSIS — F411 Generalized anxiety disorder: Secondary | ICD-10-CM | POA: Diagnosis not present

## 2020-12-24 DIAGNOSIS — F411 Generalized anxiety disorder: Secondary | ICD-10-CM | POA: Diagnosis not present

## 2020-12-28 DIAGNOSIS — H16143 Punctate keratitis, bilateral: Secondary | ICD-10-CM | POA: Diagnosis not present

## 2020-12-28 DIAGNOSIS — H40023 Open angle with borderline findings, high risk, bilateral: Secondary | ICD-10-CM | POA: Diagnosis not present

## 2020-12-29 DIAGNOSIS — Z1159 Encounter for screening for other viral diseases: Secondary | ICD-10-CM | POA: Diagnosis not present

## 2020-12-29 DIAGNOSIS — Z713 Dietary counseling and surveillance: Secondary | ICD-10-CM | POA: Diagnosis not present

## 2020-12-29 DIAGNOSIS — E6609 Other obesity due to excess calories: Secondary | ICD-10-CM | POA: Diagnosis not present

## 2020-12-29 DIAGNOSIS — Z0000 Encounter for general adult medical examination without abnormal findings: Secondary | ICD-10-CM | POA: Diagnosis not present

## 2020-12-29 DIAGNOSIS — E7800 Pure hypercholesterolemia, unspecified: Secondary | ICD-10-CM | POA: Diagnosis not present

## 2020-12-31 DIAGNOSIS — F411 Generalized anxiety disorder: Secondary | ICD-10-CM | POA: Diagnosis not present

## 2021-01-04 DIAGNOSIS — F411 Generalized anxiety disorder: Secondary | ICD-10-CM | POA: Diagnosis not present

## 2021-01-11 DIAGNOSIS — F411 Generalized anxiety disorder: Secondary | ICD-10-CM | POA: Diagnosis not present

## 2021-01-13 ENCOUNTER — Other Ambulatory Visit: Payer: Self-pay | Admitting: Family Medicine

## 2021-01-13 DIAGNOSIS — Z1231 Encounter for screening mammogram for malignant neoplasm of breast: Secondary | ICD-10-CM

## 2021-01-14 DIAGNOSIS — F411 Generalized anxiety disorder: Secondary | ICD-10-CM | POA: Diagnosis not present

## 2021-01-18 DIAGNOSIS — F411 Generalized anxiety disorder: Secondary | ICD-10-CM | POA: Diagnosis not present

## 2021-01-25 DIAGNOSIS — F411 Generalized anxiety disorder: Secondary | ICD-10-CM | POA: Diagnosis not present

## 2021-01-28 DIAGNOSIS — F411 Generalized anxiety disorder: Secondary | ICD-10-CM | POA: Diagnosis not present

## 2021-02-11 DIAGNOSIS — F411 Generalized anxiety disorder: Secondary | ICD-10-CM | POA: Diagnosis not present

## 2021-02-22 DIAGNOSIS — F411 Generalized anxiety disorder: Secondary | ICD-10-CM | POA: Diagnosis not present

## 2021-02-25 DIAGNOSIS — F411 Generalized anxiety disorder: Secondary | ICD-10-CM | POA: Diagnosis not present

## 2021-03-04 ENCOUNTER — Ambulatory Visit
Admission: RE | Admit: 2021-03-04 | Discharge: 2021-03-04 | Disposition: A | Discharge status: Home / Self Care | Payer: BC Managed Care – PPO | Source: Ambulatory Visit | Attending: Family Medicine | Admitting: Family Medicine

## 2021-03-04 ENCOUNTER — Other Ambulatory Visit: Payer: Self-pay

## 2021-03-04 DIAGNOSIS — L814 Other melanin hyperpigmentation: Secondary | ICD-10-CM | POA: Diagnosis not present

## 2021-03-04 DIAGNOSIS — R232 Flushing: Secondary | ICD-10-CM | POA: Diagnosis not present

## 2021-03-04 DIAGNOSIS — Z1231 Encounter for screening mammogram for malignant neoplasm of breast: Secondary | ICD-10-CM | POA: Diagnosis not present

## 2021-03-04 DIAGNOSIS — F411 Generalized anxiety disorder: Secondary | ICD-10-CM | POA: Diagnosis not present

## 2021-03-08 DIAGNOSIS — F411 Generalized anxiety disorder: Secondary | ICD-10-CM | POA: Diagnosis not present

## 2021-03-11 DIAGNOSIS — F411 Generalized anxiety disorder: Secondary | ICD-10-CM | POA: Diagnosis not present

## 2021-03-29 DIAGNOSIS — F411 Generalized anxiety disorder: Secondary | ICD-10-CM | POA: Diagnosis not present

## 2021-03-31 DIAGNOSIS — Z01419 Encounter for gynecological examination (general) (routine) without abnormal findings: Secondary | ICD-10-CM | POA: Diagnosis not present

## 2021-03-31 DIAGNOSIS — Z6832 Body mass index (BMI) 32.0-32.9, adult: Secondary | ICD-10-CM | POA: Diagnosis not present

## 2021-04-01 DIAGNOSIS — F411 Generalized anxiety disorder: Secondary | ICD-10-CM | POA: Diagnosis not present

## 2021-04-05 DIAGNOSIS — F411 Generalized anxiety disorder: Secondary | ICD-10-CM | POA: Diagnosis not present

## 2021-04-06 DIAGNOSIS — H40023 Open angle with borderline findings, high risk, bilateral: Secondary | ICD-10-CM | POA: Diagnosis not present

## 2021-04-06 DIAGNOSIS — H25013 Cortical age-related cataract, bilateral: Secondary | ICD-10-CM | POA: Diagnosis not present

## 2021-04-08 DIAGNOSIS — F411 Generalized anxiety disorder: Secondary | ICD-10-CM | POA: Diagnosis not present

## 2021-04-26 DIAGNOSIS — F411 Generalized anxiety disorder: Secondary | ICD-10-CM | POA: Diagnosis not present

## 2021-04-30 DIAGNOSIS — F411 Generalized anxiety disorder: Secondary | ICD-10-CM | POA: Diagnosis not present

## 2021-05-10 DIAGNOSIS — F411 Generalized anxiety disorder: Secondary | ICD-10-CM | POA: Diagnosis not present

## 2021-05-17 DIAGNOSIS — F411 Generalized anxiety disorder: Secondary | ICD-10-CM | POA: Diagnosis not present

## 2021-05-24 DIAGNOSIS — F411 Generalized anxiety disorder: Secondary | ICD-10-CM | POA: Diagnosis not present

## 2021-06-03 DIAGNOSIS — F411 Generalized anxiety disorder: Secondary | ICD-10-CM | POA: Diagnosis not present

## 2021-06-17 DIAGNOSIS — F411 Generalized anxiety disorder: Secondary | ICD-10-CM | POA: Diagnosis not present

## 2021-06-17 DIAGNOSIS — R21 Rash and other nonspecific skin eruption: Secondary | ICD-10-CM | POA: Diagnosis not present

## 2021-06-24 DIAGNOSIS — F411 Generalized anxiety disorder: Secondary | ICD-10-CM | POA: Diagnosis not present

## 2021-06-28 DIAGNOSIS — R3589 Other polyuria: Secondary | ICD-10-CM | POA: Diagnosis not present

## 2021-06-28 DIAGNOSIS — F439 Reaction to severe stress, unspecified: Secondary | ICD-10-CM | POA: Diagnosis not present

## 2021-06-28 DIAGNOSIS — F419 Anxiety disorder, unspecified: Secondary | ICD-10-CM | POA: Diagnosis not present

## 2021-06-28 DIAGNOSIS — F411 Generalized anxiety disorder: Secondary | ICD-10-CM | POA: Diagnosis not present

## 2021-06-29 ENCOUNTER — Encounter (HOSPITAL_COMMUNITY): Payer: Self-pay | Admitting: Emergency Medicine

## 2021-06-29 ENCOUNTER — Other Ambulatory Visit: Payer: Self-pay

## 2021-06-29 ENCOUNTER — Emergency Department (HOSPITAL_COMMUNITY)
Admission: EM | Admit: 2021-06-29 | Discharge: 2021-06-30 | Disposition: A | Discharge status: Home / Self Care | Payer: BC Managed Care – PPO | Attending: Emergency Medicine | Admitting: Emergency Medicine

## 2021-06-29 ENCOUNTER — Emergency Department (HOSPITAL_COMMUNITY): Payer: BC Managed Care – PPO

## 2021-06-29 DIAGNOSIS — R0902 Hypoxemia: Secondary | ICD-10-CM | POA: Diagnosis not present

## 2021-06-29 DIAGNOSIS — D4989 Neoplasm of unspecified behavior of other specified sites: Secondary | ICD-10-CM | POA: Diagnosis not present

## 2021-06-29 DIAGNOSIS — I10 Essential (primary) hypertension: Secondary | ICD-10-CM | POA: Diagnosis not present

## 2021-06-29 DIAGNOSIS — R0789 Other chest pain: Secondary | ICD-10-CM | POA: Diagnosis not present

## 2021-06-29 DIAGNOSIS — R079 Chest pain, unspecified: Secondary | ICD-10-CM

## 2021-06-29 DIAGNOSIS — J9811 Atelectasis: Secondary | ICD-10-CM | POA: Diagnosis not present

## 2021-06-29 DIAGNOSIS — E041 Nontoxic single thyroid nodule: Secondary | ICD-10-CM | POA: Insufficient documentation

## 2021-06-29 DIAGNOSIS — M940 Chondrocostal junction syndrome [Tietze]: Secondary | ICD-10-CM | POA: Insufficient documentation

## 2021-06-29 DIAGNOSIS — J45909 Unspecified asthma, uncomplicated: Secondary | ICD-10-CM | POA: Diagnosis not present

## 2021-06-29 LAB — BASIC METABOLIC PANEL
Anion gap: 9 (ref 5–15)
BUN: 14 mg/dL (ref 6–20)
CO2: 26 mmol/L (ref 22–32)
Calcium: 9.1 mg/dL (ref 8.9–10.3)
Chloride: 103 mmol/L (ref 98–111)
Creatinine, Ser: 1.07 mg/dL — ABNORMAL HIGH (ref 0.44–1.00)
GFR, Estimated: 60 mL/min — ABNORMAL LOW (ref >60)
Glucose, Bld: 100 mg/dL — ABNORMAL HIGH (ref 70–99)
Potassium: 4 mmol/L (ref 3.5–5.1)
Sodium: 138 mmol/L (ref 135–145)

## 2021-06-29 LAB — CBC
HCT: 36.3 % (ref 36.0–46.0)
Hemoglobin: 11.6 g/dL — ABNORMAL LOW (ref 12.0–15.0)
MCH: 30 pg (ref 26.0–34.0)
MCHC: 32 g/dL (ref 30.0–36.0)
MCV: 93.8 fL (ref 80.0–100.0)
Platelets: 404 10*3/uL — ABNORMAL HIGH (ref 150–400)
RBC: 3.87 MIL/uL (ref 3.87–5.11)
RDW: 14.6 % (ref 11.5–15.5)
WBC: 6.9 10*3/uL (ref 4.0–10.5)
nRBC: 0 % (ref 0.0–0.2)

## 2021-06-29 LAB — TROPONIN I (HIGH SENSITIVITY)
Troponin I (High Sensitivity): 4 ng/L (ref <18)
Troponin I (High Sensitivity): 9 ng/L (ref <18)

## 2021-06-29 LAB — D-DIMER, QUANTITATIVE: D-Dimer, Quant: 1.12 ug/mL-FEU — ABNORMAL HIGH (ref 0.00–0.50)

## 2021-06-29 MED ORDER — IBUPROFEN 400 MG PO TABS
600.0000 mg | ORAL_TABLET | Freq: Once | ORAL | Status: AC
  Administered 2021-06-29: 600 mg via ORAL
  Filled 2021-06-29: qty 1

## 2021-06-29 NOTE — ED Triage Notes (Signed)
Pt arrives via EMS - started having mid sternal CP over the past three days but has worsened today. Pt has a lot of stress in her home dealing with her adopted daughter, per EMS. Pain 10/10- EMS gave '324mg'$  ASA, and 2 nitro. Pain decreased to 7/10. BP 136/90
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
## 2021-06-30 ENCOUNTER — Emergency Department (HOSPITAL_COMMUNITY): Payer: BC Managed Care – PPO

## 2021-06-30 DIAGNOSIS — J9811 Atelectasis: Secondary | ICD-10-CM | POA: Diagnosis not present

## 2021-06-30 DIAGNOSIS — R079 Chest pain, unspecified: Secondary | ICD-10-CM | POA: Diagnosis not present

## 2021-06-30 MED ORDER — LIDOCAINE 5 % EX PTCH: 1.0000 | MEDICATED_PATCH | CUTANEOUS | 0 refills | Status: DC

## 2021-06-30 MED ORDER — IOHEXOL 350 MG/ML SOLN
60.0000 mL | Freq: Once | INTRAVENOUS | Status: AC | PRN
  Administered 2021-06-30: 60 mL via INTRAVENOUS

## 2021-06-30 MED ORDER — IBUPROFEN 600 MG PO TABS: 600.0000 mg | ORAL_TABLET | Freq: Four times a day (QID) | ORAL | 0 refills | Status: DC | PRN

## 2021-06-30 NOTE — ED Provider Notes (Signed)
Patient signed out pending CTA of the chest.  CTA is negative for PE.  She does have 2 incidental findings including a possible thymoma and thyroid nodule.  Both need follow-up imaging as an outpatient.  I discussed these with the patient.  These are likely noncontributory to her current presentation  After history, exam, and medical workup I feel the patient has been appropriately medically screened and is safe for discharge home. Pertinent diagnoses were discussed with the patient. Patient was given return precautions.    Physical Exam  BP 129/88   Pulse 88   Temp 98.2 F (36.8 C) (Oral)   Resp 18   SpO2 98%   Physical Exam  ED Course/Procedures     Procedures  MDM   Problem List Items Addressed This Visit   None Visit Diagnoses     Costochondritis    -  Primary   Chest pain, unspecified type       Thymoma       Thyroid cyst                 Merryl Hacker, MD 06/30/21 [phone]

## 2021-06-30 NOTE — Discharge Instructions (Addendum)
You were seen today for chest pain.  Your CT does not show any evidence of blood clot.  You do have 2 what we call incidental findings.  You potentially have a thymoma which will need further evaluation with a chest MRI as an outpatient.  Additionally, he had a cyst on her thyroid.  They recommend outpatient ultrasound.  Both of these may be incidental and not contributory to your current symptoms but do need follow-up.

## 2021-07-01 DIAGNOSIS — F411 Generalized anxiety disorder: Secondary | ICD-10-CM | POA: Diagnosis not present

## 2021-07-02 ENCOUNTER — Other Ambulatory Visit: Payer: Self-pay | Admitting: Family Medicine

## 2021-07-02 ENCOUNTER — Other Ambulatory Visit (HOSPITAL_COMMUNITY): Payer: Self-pay | Admitting: Family Medicine

## 2021-07-02 DIAGNOSIS — J9859 Other diseases of mediastinum, not elsewhere classified: Secondary | ICD-10-CM

## 2021-07-07 DIAGNOSIS — L818 Other specified disorders of pigmentation: Secondary | ICD-10-CM | POA: Diagnosis not present

## 2021-07-09 DIAGNOSIS — E042 Nontoxic multinodular goiter: Secondary | ICD-10-CM | POA: Diagnosis not present

## 2021-07-09 DIAGNOSIS — E079 Disorder of thyroid, unspecified: Secondary | ICD-10-CM | POA: Diagnosis not present

## 2021-07-12 DIAGNOSIS — F411 Generalized anxiety disorder: Secondary | ICD-10-CM | POA: Diagnosis not present

## 2021-07-14 ENCOUNTER — Other Ambulatory Visit: Payer: Self-pay

## 2021-07-14 ENCOUNTER — Ambulatory Visit (HOSPITAL_COMMUNITY)
Admission: RE | Admit: 2021-07-14 | Discharge: 2021-07-14 | Disposition: A | Discharge status: Home / Self Care | Payer: BC Managed Care – PPO | Source: Ambulatory Visit | Attending: Family Medicine | Admitting: Family Medicine

## 2021-07-14 DIAGNOSIS — R222 Localized swelling, mass and lump, trunk: Secondary | ICD-10-CM | POA: Diagnosis not present

## 2021-07-14 DIAGNOSIS — J9859 Other diseases of mediastinum, not elsewhere classified: Secondary | ICD-10-CM | POA: Diagnosis not present

## 2021-07-14 DIAGNOSIS — D383 Neoplasm of uncertain behavior of mediastinum: Secondary | ICD-10-CM | POA: Diagnosis not present

## 2021-07-14 MED ORDER — GADOBUTROL 1 MMOL/ML IV SOLN
7.0000 mL | Freq: Once | INTRAVENOUS | Status: AC | PRN
  Administered 2021-07-14: 8 mL via INTRAVENOUS

## 2021-07-15 DIAGNOSIS — F411 Generalized anxiety disorder: Secondary | ICD-10-CM | POA: Diagnosis not present

## 2021-07-19 ENCOUNTER — Other Ambulatory Visit: Payer: Self-pay | Admitting: Family Medicine

## 2021-07-19 DIAGNOSIS — E079 Disorder of thyroid, unspecified: Secondary | ICD-10-CM

## 2021-07-19 DIAGNOSIS — F411 Generalized anxiety disorder: Secondary | ICD-10-CM | POA: Diagnosis not present

## 2021-07-20 ENCOUNTER — Telehealth: Payer: Self-pay | Admitting: Oncology

## 2021-07-20 ENCOUNTER — Encounter: Payer: Self-pay | Admitting: Oncology

## 2021-07-20 DIAGNOSIS — I10 Essential (primary) hypertension: Secondary | ICD-10-CM | POA: Diagnosis not present

## 2021-07-20 DIAGNOSIS — F439 Reaction to severe stress, unspecified: Secondary | ICD-10-CM | POA: Diagnosis not present

## 2021-07-20 DIAGNOSIS — J9859 Other diseases of mediastinum, not elsewhere classified: Secondary | ICD-10-CM | POA: Diagnosis not present

## 2021-07-20 DIAGNOSIS — R739 Hyperglycemia, unspecified: Secondary | ICD-10-CM | POA: Diagnosis not present

## 2021-07-20 DIAGNOSIS — E7800 Pure hypercholesterolemia, unspecified: Secondary | ICD-10-CM | POA: Diagnosis not present

## 2021-07-20 NOTE — Telephone Encounter (Signed)
AMB REFERRAL TO HEMATOLOGY/ONCOLOGY received from Dr. Yaakov Guthrie for eval of incidental mass of mediastinum s/p visit to ED.  Incidental thyroid mass identified as well.  Dr. Jacelyn Grip has arranged for thyroid biopsy on 08/12/2021 and also has referral to CVTS, sees Dr. Roxan Hockey on 08/03/2021.  Appointment made with [facility] at [facility] to establish care in oncology and to coordinate further diagnostics, prognostics and referral to appropriate oncologist.    Taylor Chen was contacted regarding the referral to oncology, and scheduled to the [facility].  The patient is aware of her reason for referral to oncology and seemed relieved to be establishing care with [place].  Advised to bring insurance cards, list of medications with dose/frequency, and she can eat/drink per usual prior to the [facility] appointment.  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  Seemed a bit overwhelmed with the number of appointments needing to keep up with, advised to please reference MyChart for appointments when being seen by [facility].  She is aware of her 10/18 appointment with Dr. Roxan Hockey.    No questions at this time, per patient.

## 2021-07-22 NOTE — Progress Notes (Signed)
[place] [phone]   [phone]   INITIAL CONSULTATION:  Patient Care Team: [name], MD as PCP - General (Family Medicine)  CHIEF COMPLAINTS/PURPOSE OF CONSULTATION:  Anterior mediastinal mass  HISTORY OF PRESENTING ILLNESS:  Taylor Chen 60 y.o. female presents to the clinic for evaluation for anterior mediastinal mass.   On review of the previous records, Taylor Chen presented to the emergency room on 06/29/2021 for chest pain. Workup included a CT angiogram chest that revealed partially calcified soft tissue mass within the anterior aspect of the superior mediastinum. Additionally, there was evidence of 2.5 x 1.8 cm cyst within the left lobe of the thyroid gland. 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   On exam today, Taylor Chen reports that her energy and appetite levels are stable. She is able to complete her daily activities independently.  She has a good appetite but has noticed a 3 pound weight loss i over the last 2 weeks.  She reports proximal dysphagia, mainly with solid foods and occasionally with liquids.  She denies odynophagia or regurgitation.  She reports intermittent episodes central chest pain for the last 4 weeks.  She describes the pain as a burning sensation and rates it as high as 10 out of 10 on a pain scale.  She is currently not taking any pain medications.  Patient denies nausea, vomiting or abdominal pain.  Her bowel movements are regular without any diarrhea or constipation.  She denies easy bruising or signs of bleeding.  Patient has noticed increased sweating throughout the day for the last several months.  She denies any fevers, chills, shortness of breath, cough, hair loss, rash or skin changes.  She has no other complaints.  Rest of the 10 point ROS is below.  MEDICAL HISTORY:  Past Medical History:  Diagnosis Date    Anemia    IN HER 20'S   Anginal pain ([place])    LAST COUPLE DAYS   Anxiety    Asthma    CHILDHOOD   Depression    Headache    MIGRAINES   Heart murmur    HAS BEEN TOLD   History of stress test 2012   Hypertension    CONTROLLED BY DIET    SURGICAL HISTORY: Past Surgical History:  Procedure Laterality Date   DILATATION & CURETTAGE/HYSTEROSCOPY WITH MYOSURE N/A 08/25/2016   Procedure: DILATATION & CURETTAGE/HYSTEROSCOPY WITH MYOSURE;  Surgeon: Linda Hedges, DO;  Location: [facility];  Service: Gynecology;  Laterality: N/A;   HYSTOSCOPY  2010   INCISION AND DRAINAGE  1999    SOCIAL HISTORY: Social History   Socioeconomic History   Marital status: Single    Spouse name: Not on file   Number of children: Not on file   Years of education: Not on file   Highest education level: Not on file  Occupational History   Not on file  Tobacco Use   Smoking status: Never   Smokeless tobacco: Never  Substance and Sexual Activity   Alcohol use: No   Drug use: No   Sexual activity: Not Currently  Other Topics Concern   Not on file  Social History Narrative   Not on file   Social Determinants of Health   Financial Resource Strain: Not on file  Food Insecurity: Not on file  Transportation Needs: Not on file  Physical Activity: Not on file  Stress:  Not on file  Social Connections: Not on file  Intimate Partner Violence: Not on file    FAMILY HISTORY: Family History  Problem Relation Age of Onset   Breast cancer Mother 42       No genetic testing    ALLERGIES:  is allergic to hydrochlorothiazide, lisinopril, metoprolol, penicillins, and percocet [oxycodone-acetaminophen].  MEDICATIONS:  Current Outpatient Medications  Medication Sig Dispense Refill   amLODipine (NORVASC) 5 MG tablet Take by mouth.     cetirizine (ZYRTEC) 10 MG tablet Take 10 mg by mouth daily as needed for allergies.     doxepin (SINEQUAN) 75 MG capsule Take by mouth.     latanoprost (XALATAN) 0.005 %  ophthalmic solution Place 1 drop into both eyes at bedtime.     Multiple Vitamins-Minerals (ONE-A-DAY 50 PLUS PO) Take 1 tablet by mouth daily.     pravastatin (PRAVACHOL) 40 MG tablet Take 80 mg by mouth every evening.     SUMAtriptan (IMITREX) 50 MG tablet Take 50 mg by mouth once. May repeat in 2 hours if headache persists or recurs.     No current facility-administered medications for this visit.    REVIEW OF SYSTEMS:   Constitutional: ( - ) fevers, ( - )  chills , ( - ) night sweats Eyes: ( - ) blurriness of vision, ( - ) double vision, ( - ) watery eyes Ears, nose, mouth, throat, and face: ( - ) mucositis, ( - ) sore throat Respiratory: ( - ) cough, ( - ) dyspnea, ( - ) wheezes Cardiovascular: ( - ) palpitation, ( + ) chest discomfort, ( - ) lower extremity swelling Gastrointestinal:  ( - ) nausea, ( - ) heartburn, ( - ) change in bowel habits Skin: ( - ) abnormal skin rashes Lymphatics: ( - ) new lymphadenopathy, ( - ) easy bruising Neurological: ( - ) numbness, ( - ) tingling, ( - ) new weaknesses Behavioral/Psych: ( - ) mood change, ( - ) new changes  All other systems were reviewed with the patient and are negative.  PHYSICAL EXAMINATION: ECOG PERFORMANCE STATUS: 1 - Symptomatic but completely ambulatory  Vitals:   07/23/21 0924  BP: 114/85  Pulse: (!) 110  Resp: 18  Temp: 98.1 F (36.7 C)  SpO2: 100%   Filed Weights   07/23/21 0924  Weight: 179 lb (81.2 kg)    GENERAL: well appearing African American female in NAD  SKIN: skin color, texture, turgor are normal, no rashes or significant lesions EYES: conjunctiva are pink and non-injected, sclera clear OROPHARYNX: no exudate, no erythema; lips, buccal mucosa, and tongue normal  NECK: fullness and point tenderness in the left thyroid. LYMPH:  no palpable lymphadenopathy in the cervical, axillary or supraclavicular lymph nodes.  LUNGS: clear to auscultation and percussion with normal breathing effort HEART: regular  rate & rhythm and no murmurs and no lower extremity edema ABDOMEN: soft, non-tender, non-distended, normal bowel sounds Musculoskeletal: no cyanosis of digits and no clubbing  PSYCH: alert & oriented x 3, fluent speech NEURO: no focal motor/sensory deficits  LABORATORY DATA:  I have reviewed the data as listed CBC Latest Ref Rng & Units 07/23/2021 06/29/2021 08/24/2016  WBC 4.0 - 10.5 K/uL 5.3 6.9 8.0  Hemoglobin 12.0 - 15.0 g/dL 12.4 11.6(L) 12.9  Hematocrit 36.0 - 46.0 % 38.5 36.3 38.7  Platelets 150 - 400 K/uL 459(H) 404(H) 383    CMP Latest Ref Rng & Units 07/23/2021 06/29/2021  Glucose 70 - 99 mg/dL 86  100(H)  BUN 6 - 20 mg/dL 13 14  Creatinine 0.44 - 1.00 mg/dL 1.14(H) 1.07(H)  Sodium 135 - 145 mmol/L 142 138  Potassium 3.5 - 5.1 mmol/L 4.2 4.0  Chloride 98 - 111 mmol/L 105 103  CO2 22 - 32 mmol/L 27 26  Calcium 8.9 - 10.3 mg/dL 9.8 9.1  Total Protein 6.5 - 8.1 g/dL 7.8 -  Total Bilirubin 0.3 - 1.2 mg/dL 0.3 -  Alkaline Phos 38 - 126 U/L 72 -  AST 15 - 41 U/L 40 -  ALT 0 - 44 U/L 104(H) -     RADIOGRAPHIC STUDIES: I have personally reviewed the radiological images as listed and agreed with the findings in the report. DG Chest 2 View  Result Date: 06/29/2021 CLINICAL DATA:  Chest pain. EXAM: CHEST - 2 VIEW COMPARISON:  None. FINDINGS: The heart size and mediastinal contours are within normal limits. Both lungs are clear. No visible pleural effusions or pneumothorax. No acute osseous abnormality. IMPRESSION: No active cardiopulmonary disease. Electronically Signed   By: Margaretha Sheffield M.D.   On: 06/29/2021 13:20   CT Angio Chest PE W and/or Wo Contrast  Result Date: 06/30/2021 CLINICAL DATA:  Midsternal chest pain x3 days. EXAM: CT ANGIOGRAPHY CHEST WITH CONTRAST TECHNIQUE: Multidetector CT imaging of the chest was performed using the standard protocol during bolus administration of intravenous contrast. Multiplanar CT image reconstructions and MIPs were obtained to evaluate  the vascular anatomy. CONTRAST:  77mL OMNIPAQUE IOHEXOL 350 MG/ML SOLN COMPARISON:  None. FINDINGS: Cardiovascular: Satisfactory opacification of the pulmonary arteries to the segmental level. No evidence of pulmonary embolism. Normal heart size. No pericardial effusion. Mediastinum/Nodes: No enlarged mediastinal, hilar, or axillary lymph nodes. A 4.4 cm x 2.7 cm x 1.6 cm partially calcified soft tissue mass is seen within the anterior aspect of the superior mediastinum, to the left of midline. This extends from the level of the aortic arch to the main pulmonary artery (axial CT images 26 through 39, CT series 5). A 2.5 cm x 1.8 cm cyst is seen within the inferior aspect of the left lobe of the thyroid gland. The trachea and esophagus demonstrate no significant findings. Lungs/Pleura: Very mild atelectasis is seen within the left lung base. There is no evidence of acute infiltrate, pleural effusion or pneumothorax. Upper Abdomen: No acute abnormality. Musculoskeletal: No chest wall abnormality. No acute or significant osseous findings. Review of the MIP images confirms the above findings. IMPRESSION: 1. No evidence of pulmonary embolism or acute cardiopulmonary disease. 2. Partially calcified soft tissue mass within the anterior aspect of the superior mediastinum which may represent a thymoma. Further evaluation with nonemergent MRI is recommended. 3. 2.5 cm x 1.8 cm cyst within the left lobe of the thyroid gland. Further evaluation with nonemergent thyroid ultrasound is recommended. Electronically Signed   By: Virgina Norfolk M.D.   On: 06/30/2021 01:21   MR CHEST W WO CONTRAST  Result Date: 07/15/2021 CLINICAL DATA:  60 year old female with history of mediastinal mass noted on prior chest CT. Follow-up study. EXAM: MRI CHEST WITHOUT AND WITH CONTRAST TECHNIQUE: Multiplanar multisequence MR imaging was performed of the thorax before and after the administration of IV gadolinium. CONTRAST:  30mL GADAVIST  GADOBUTROL 1 MMOL/ML IV SOLN COMPARISON:  No prior chest MRI.  Chest CTA 06/30/2021. FINDINGS: In the anterior mediastinum there is again a multilobular mass (axial image 35 of series 20 and coronal image 11 of series 23) which measures approximately 4.5 x 2.3 x 4.9 cm.  This lesion is isointense to muscle on T1 weighted images and heterogeneous in signal intensity on T2 weighted images. No change in signal intensity appreciated on out of phase dual echo images. There is a focal signal void corresponding to the coarse calcification on prior CT examination. Post gadolinium images demonstrates some heterogeneous internal enhancement. No definite areas of pleural nodularity or enhancement are confidently identified within the thorax. IMPRESSION: 1. 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 Surgical consultation for biopsy and consideration of surgical resection is recommended. If this does represent a malignant thymic lesion, there is no definitive evidence of pleural metastatic disease at this time. Electronically Signed   By: Vinnie Langton M.D.   On: 07/15/2021 10:38    Taylor Chen is a pleasant 60 y.o. female who presents to the diagnostic clinic for newly diagnosed anterior mediastinal mass and left thyroid lobe mass. Ms. Armbrust is currently scheduled for thyroid biopsy on 08/12/2021 and also has referral to CVTS, sees Dr. Roxan Hockey on 08/03/2021.    Reviewed differentials for mediastinal mass including thymoma, thymic carcinoma, germ cell tumor and lymphoma. Patient will proceed with initial laboratory evaluation with CBC, CMP, LDH, AFP, Beta-HCG, TSH, T3 and T4 levels. We will await surgical biopsy results and arrange a follow up with a medical  oncologist if needed.   #Anterior mediastinal mass: --Labs today to check CBC, CMP, LDH, AFP, Beta-HCG, TSH, T3 and T4 levels. --Currently scheduled for a consultation with Dr. Roxan Hockey from cardiothoracic surgery on 08/03/2021.  --Need surgical biopsy to confirm diagnosis.  --RTC pending pathology results.    #Left lobe thyroid nodule: --Currently scheduled for US guided thyroid biopsy on 08/12/2021.   #Other symptoms: --Central chest pain: Likely secondary to mediastinal mass. Monitor for now as patient does not require any pain medication. --Dysphagia: Likely secondary to thyroid nodule. Monitor for now.   Orders Placed This Encounter  Procedures   CBC with Differential ([place] Only)    Standing Status:   Future    Number of Occurrences:   1    Standing Expiration Date:   07/21/2022   CMP ([place] only)    Standing Status:   Future    Number of Occurrences:   1    Standing Expiration Date:   07/21/2022   Lactate dehydrogenase (LDH)    Standing Status:   Future    Number of Occurrences:   1    Standing Expiration Date:   07/21/2022   TSH    Standing Status:   Future    Number of Occurrences:   1    Standing Expiration Date:   07/22/2022   T3 uptake    Standing Status:   Future    Number of Occurrences:   1    Standing Expiration Date:   07/22/2022   T4, free    Standing Status:   Future    Number of Occurrences:   1    Standing Expiration Date:   07/22/2022   AFP tumor marker    Standing Status:   Future    Number of Occurrences:   1    Standing Expiration Date:   07/22/2022   Beta HCG, Quant (tumor marker)    Standing Status:   Future    Number of Occurrences:  1    Standing Expiration Date:   07/22/2022    All questions were answered. The patient knows to call the clinic with any problems, questions or concerns.  I have spent a total of 60 minutes minutes of face-to-face and non-face-to-face time, preparing to see the patient, obtaining and/or  reviewing separately obtained history, performing a medically appropriate examination, counseling and educating the patient, ordering tests documenting clinical information in the electronic health record, and care coordination.   Dede Query, PA-C Department of Hematology/Oncology [place] [facility] Phone: [phone]

## 2021-07-23 ENCOUNTER — Inpatient Hospital Stay: Payer: BC Managed Care – PPO | Attending: Physician Assistant | Admitting: Physician Assistant

## 2021-07-23 ENCOUNTER — Other Ambulatory Visit: Payer: Self-pay

## 2021-07-23 ENCOUNTER — Encounter: Payer: Self-pay | Admitting: Physician Assistant

## 2021-07-23 ENCOUNTER — Inpatient Hospital Stay: Payer: BC Managed Care – PPO

## 2021-07-23 VITALS — BP 114/85 | HR 110 | Temp 98.1°F | Resp 18 | Ht 61.0 in | Wt 179.0 lb

## 2021-07-23 DIAGNOSIS — J9859 Other diseases of mediastinum, not elsewhere classified: Secondary | ICD-10-CM

## 2021-07-23 DIAGNOSIS — R0789 Other chest pain: Secondary | ICD-10-CM | POA: Insufficient documentation

## 2021-07-23 DIAGNOSIS — Z803 Family history of malignant neoplasm of breast: Secondary | ICD-10-CM | POA: Diagnosis not present

## 2021-07-23 DIAGNOSIS — E041 Nontoxic single thyroid nodule: Secondary | ICD-10-CM | POA: Insufficient documentation

## 2021-07-23 DIAGNOSIS — R1310 Dysphagia, unspecified: Secondary | ICD-10-CM | POA: Insufficient documentation

## 2021-07-23 DIAGNOSIS — Z79899 Other long term (current) drug therapy: Secondary | ICD-10-CM | POA: Insufficient documentation

## 2021-07-23 LAB — T4, FREE: Free T4: 0.66 ng/dL (ref 0.61–1.12)

## 2021-07-23 LAB — CBC WITH DIFFERENTIAL (CANCER CENTER ONLY)
Abs Immature Granulocytes: 0.02 10*3/uL (ref 0.00–0.07)
Basophils Absolute: 0 10*3/uL (ref 0.0–0.1)
Basophils Relative: 1 %
Eosinophils Absolute: 0.1 10*3/uL (ref 0.0–0.5)
Eosinophils Relative: 2 %
HCT: 38.5 % (ref 36.0–46.0)
Hemoglobin: 12.4 g/dL (ref 12.0–15.0)
Immature Granulocytes: 0 %
Lymphocytes Relative: 25 %
Lymphs Abs: 1.3 10*3/uL (ref 0.7–4.0)
MCH: 29.6 pg (ref 26.0–34.0)
MCHC: 32.2 g/dL (ref 30.0–36.0)
MCV: 91.9 fL (ref 80.0–100.0)
Monocytes Absolute: 0.4 10*3/uL (ref 0.1–1.0)
Monocytes Relative: 7 %
Neutro Abs: 3.4 10*3/uL (ref 1.7–7.7)
Neutrophils Relative %: 65 %
Platelet Count: 459 10*3/uL — ABNORMAL HIGH (ref 150–400)
RBC: 4.19 MIL/uL (ref 3.87–5.11)
RDW: 13.8 % (ref 11.5–15.5)
WBC Count: 5.3 10*3/uL (ref 4.0–10.5)
nRBC: 0 % (ref 0.0–0.2)

## 2021-07-23 LAB — CMP (CANCER CENTER ONLY)
ALT: 104 U/L — ABNORMAL HIGH (ref 0–44)
AST: 40 U/L (ref 15–41)
Albumin: 4.3 g/dL (ref 3.5–5.0)
Alkaline Phosphatase: 72 U/L (ref 38–126)
Anion gap: 10 (ref 5–15)
BUN: 13 mg/dL (ref 6–20)
CO2: 27 mmol/L (ref 22–32)
Calcium: 9.8 mg/dL (ref 8.9–10.3)
Chloride: 105 mmol/L (ref 98–111)
Creatinine: 1.14 mg/dL — ABNORMAL HIGH (ref 0.44–1.00)
GFR, Estimated: 55 mL/min — ABNORMAL LOW (ref >60)
Glucose, Bld: 86 mg/dL (ref 70–99)
Potassium: 4.2 mmol/L (ref 3.5–5.1)
Sodium: 142 mmol/L (ref 135–145)
Total Bilirubin: 0.3 mg/dL (ref 0.3–1.2)
Total Protein: 7.8 g/dL (ref 6.5–8.1)

## 2021-07-23 LAB — LACTATE DEHYDROGENASE: LDH: 246 U/L — ABNORMAL HIGH (ref 98–192)

## 2021-07-23 LAB — TSH: TSH: 2.241 u[IU]/mL (ref 0.308–3.960)

## 2021-07-24 LAB — AFP TUMOR MARKER: AFP, Serum, Tumor Marker: 2.2 ng/mL (ref 0.0–9.2)

## 2021-07-24 LAB — BETA HCG QUANT (REF LAB): hCG Quant: <1 m[IU]/mL

## 2021-07-24 LAB — T3 UPTAKE: T3 Uptake Ratio: 23 % — ABNORMAL LOW (ref 24–39)

## 2021-07-26 DIAGNOSIS — F411 Generalized anxiety disorder: Secondary | ICD-10-CM | POA: Diagnosis not present

## 2021-07-29 ENCOUNTER — Other Ambulatory Visit: Payer: Self-pay | Admitting: *Deleted

## 2021-07-29 ENCOUNTER — Telehealth: Payer: Self-pay | Admitting: Physician Assistant

## 2021-07-29 NOTE — Telephone Encounter (Signed)
I called Taylor Chen to follow up following our initial visit on 07/23/2021. Reviewed laboratory results with her not requiring any intervention at this time. Discussed recommendations of surgical resection from the multidisciplinary conference from today. Patient will meet with Dr. Roxan Hockey next Tuesday, 08/03/2021.   She expressed understanding and satisfaction with the plan provided.

## 2021-07-29 NOTE — Progress Notes (Signed)
The proposed treatment discussed in conference is for discussion purpose only and is not a binding recommendation. The patient was not been physically examined, or presented with their treatment options. Therefore, final treatment plans cannot be decided.  

## 2021-08-02 DIAGNOSIS — F411 Generalized anxiety disorder: Secondary | ICD-10-CM | POA: Diagnosis not present

## 2021-08-03 ENCOUNTER — Encounter: Payer: Self-pay | Admitting: Thoracic Surgery (Cardiothoracic Vascular Surgery)

## 2021-08-03 ENCOUNTER — Other Ambulatory Visit: Payer: Self-pay | Admitting: *Deleted

## 2021-08-03 ENCOUNTER — Other Ambulatory Visit: Payer: Self-pay

## 2021-08-03 ENCOUNTER — Institutional Professional Consult (permissible substitution) (INDEPENDENT_AMBULATORY_CARE_PROVIDER_SITE_OTHER): Payer: BC Managed Care – PPO | Admitting: Thoracic Surgery (Cardiothoracic Vascular Surgery)

## 2021-08-03 VITALS — BP 143/92 | HR 108 | Resp 20 | Ht 61.0 in | Wt 178.0 lb

## 2021-08-03 DIAGNOSIS — Z01818 Encounter for other preprocedural examination: Secondary | ICD-10-CM

## 2021-08-03 DIAGNOSIS — J9859 Other diseases of mediastinum, not elsewhere classified: Secondary | ICD-10-CM | POA: Diagnosis not present

## 2021-08-03 NOTE — H&P (View-Only) (Signed)
PCP is [name], MD Referring Provider is [name], MD  Chief Complaint  Patient presents with   Mediastinal Mass    New patient consultation MR Chest 9/28, chest CT 9/14     HPI: Taylor Chen is sent for consultation regarding an anterior mediastinal mass.  Taylor Chen is a 60 year old with a past medical history significant for hypertension, heart murmur, hyperlipidemia, migraines, anxiety, thyroid nodule and a mediastinal mass.  She presented to the emergency room on 06/29/2021 with left anterior chest pain.  She described this as a burning sensation.  There was no radiation.  She was not having any shortness of breath.  Cardiac work-up was negative.  She had a CT of the chest which showed no evidence of pulmonary embolus or dissection.  She was noted to have an anterior mediastinal mass and a thyroid nodule.  She has not had any more severe episodes of the pain since then but has noted few mild episodes.  She also complains of some night sweats.  She is a lifelong non-smoker.  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  She has lost about 4 pounds over the past 3 months.  She has not had any problems with double vision or weakness.  She does complain of some difficulty swallowing and the sensation that food sticks in her throat. Zubrod Score: At the time of surgery this patient's most appropriate activity status/level should be described as: [x]     0    Normal activity, no symptoms []     1    Restricted in physical strenuous activity but ambulatory, able to do out light work []     2    Ambulatory and capable of self care, unable to do work activities, up and about >50 % of waking hours                              []     3    Only limited self care, in bed greater than 50% of waking hours []     4    Completely disabled, no self care, confined to bed or chair []     5    Moribund   Past Medical History:  Diagnosis Date   Anemia    IN HER 20'S   Anginal pain  (HCC)    LAST COUPLE DAYS   Anxiety    Asthma    CHILDHOOD   Depression    Headache    MIGRAINES   Heart murmur    HAS BEEN TOLD   History of stress test 2012   Hypertension    CONTROLLED BY DIET    Past Surgical History:  Procedure Laterality Date   DILATATION & CURETTAGE/HYSTEROSCOPY WITH MYOSURE N/A 08/25/2016   Procedure: DILATATION & CURETTAGE/HYSTEROSCOPY WITH MYOSURE;  Surgeon: Linda Hedges, DO;  Location: [facility];  Service: Gynecology;  Laterality: N/A;   HYSTOSCOPY  2010   INCISION AND DRAINAGE  1999    Family History  Problem Relation Age of Onset   Breast cancer Mother 20       No genetic testing    Social History Social History   Tobacco Use   Smoking status: Never   Smokeless tobacco: Never  Substance Use Topics   Alcohol use: No   Drug use: No    Current Outpatient Medications  Medication Sig Dispense Refill   amLODipine (NORVASC) 5 MG tablet Take by  mouth.     cetirizine (ZYRTEC) 10 MG tablet Take 10 mg by mouth daily as needed for allergies.     doxepin (SINEQUAN) 75 MG capsule Take 150 mg by mouth.     latanoprost (XALATAN) 0.005 % ophthalmic solution Place 1 drop into both eyes at bedtime.     Multiple Vitamins-Minerals (ONE-A-DAY 50 PLUS PO) Take 1 tablet by mouth daily.     pravastatin (PRAVACHOL) 40 MG tablet Take 80 mg by mouth every evening.     SUMAtriptan (IMITREX) 50 MG tablet Take 50 mg by mouth once. May repeat in 2 hours if headache persists or recurs.     No current facility-administered medications for this visit.    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 Reaction: yeast infection    Percocet [Oxycodone-Acetaminophen] Nausea And Vomiting    Review of Systems  Constitutional:  Positive for activity change and diaphoresis. Negative for appetite change and unexpected weight change.  HENT:  Positive for trouble swallowing. Negative for voice change.   Eyes:  Negative for visual disturbance.  Respiratory:  Positive for cough.   Cardiovascular:  Positive for chest pain (See HPI).  Neurological:  Negative for dizziness and seizures.  Hematological:  Bruises/bleeds easily.  Psychiatric/Behavioral:  Positive for dysphoric mood. The patient is nervous/anxious.   All other systems reviewed and are negative.  BP (!) 143/92 (BP Location: Left Arm, Patient Position: Sitting, Cuff Size: Normal)   Pulse (!) 108   Resp 20   Ht 5\' 1"  (1.549 m)   Wt 178 lb (80.7 kg)   SpO2 96% Comment: RA  BMI 33.63 kg/m  Physical Exam Vitals reviewed.  Constitutional:      General: She is not in acute distress.    Appearance: Normal appearance. She is obese.  HENT:     Head: Normocephalic and atraumatic.  Eyes:     General: No scleral icterus.    Extraocular Movements: Extraocular movements intact.  Cardiovascular:     Rate and Rhythm: Normal rate and regular rhythm.     Heart sounds: Murmur (2/6 systolic) heard.  Pulmonary:     Effort: Pulmonary effort is normal. No respiratory distress.     Breath sounds: Normal breath sounds. No wheezing or rales.  Abdominal:     General: There is no distension.     Palpations: Abdomen is soft.     Tenderness: There is no abdominal tenderness.  Musculoskeletal:     Cervical back: Neck supple.  Lymphadenopathy:     Cervical: No cervical adenopathy.   Skin:    General: Skin is warm and dry.  Neurological:     General: No focal deficit present.     Mental Status: She is alert and oriented to person, place, and time.     Cranial Nerves: No cranial nerve deficit.     Motor: No weakness.  Diagnostic Tests: CT ANGIOGRAPHY CHEST WITH CONTRAST   TECHNIQUE: Multidetector CT imaging of the chest was performed using the standard protocol during bolus administration of intravenous contrast. Multiplanar CT image reconstructions and MIPs were obtained to evaluate the vascular anatomy.   CONTRAST:  25mL OMNIPAQUE IOHEXOL 350 MG/ML SOLN   COMPARISON:  None.   FINDINGS: Cardiovascular: Satisfactory opacification of the pulmonary arteries to the segmental level. No evidence of pulmonary embolism. Normal heart size. No pericardial effusion.   Mediastinum/Nodes: No enlarged mediastinal, hilar, or axillary lymph nodes.   A 4.4 cm x 2.7 cm x 1.6 cm partially calcified soft tissue mass is seen within the anterior aspect of the superior mediastinum, to the left of midline. This extends from the level of the aortic arch to the main pulmonary artery (axial CT images 26 through 39, CT series 5).   A 2.5 cm x 1.8 cm cyst is seen within the inferior aspect of the left lobe of the thyroid gland.   The trachea and esophagus demonstrate no significant findings.   Lungs/Pleura: Very mild atelectasis is seen within the left lung base.   There is no evidence of acute infiltrate, pleural effusion or pneumothorax.   Upper Abdomen: No acute abnormality.   Musculoskeletal: No chest wall abnormality. No acute or significant osseous findings.   Review of the MIP images confirms the above findings.   IMPRESSION: 1. No evidence of pulmonary embolism or acute cardiopulmonary disease. 2. Partially calcified soft tissue mass within the anterior aspect of the superior mediastinum which may represent a thymoma. Further evaluation with nonemergent  MRI is recommended. 3. 2.5 cm x 1.8 cm cyst within the left lobe of the thyroid gland. Further evaluation with nonemergent thyroid ultrasound is recommended.     Electronically Signed   By: Virgina Norfolk M.D.   On: 06/30/2021 01:21 I personally reviewed the CT images.  There is a 4.4 x 2.8 x 1.5 cm mass in the anterior mediastinum on the left.  There is some internal calcification.  Also a cystic thyroid nodule on the left side.  Beta-hCG < 1 alpha-fetoprotein= 2.2  Impression: Taylor Chen 60 year old woman with a past medical history significant for hypertension, heart murmur, hyperlipidemia, migraines, anxiety, thyroid nodule and a mediastinal mass.  Anterior mediastinal mass-some internal calcification.  Differential diagnosis is primarily thymoma or thymic carcinoma versus germ cell tumors.  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  Lymphoma less likely.  Alpha-fetoprotein and beta-hCG were negative.  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  I discussed the differential diagnosis with Taylor Chen.  We discussed options of eventually trying to biopsy this versus just proceeding with surgical resection.  I favor proceeding with surgical resection.  This can be done either via a sternotomy or robotic left VATS approach.  We discussed the relative advantages and disadvantages of each of those approaches.  I do think she needs a PET/CT prior to proceeding with surgery.  Findings on that could influence which approach we decide to use.  I described both approaches to her.  Depending on PET findings my preferred approach would be robotic left VATS.  She understands there is possibility we might have to convert to a sternotomy.  I informed her of the general nature of the procedure including the incisions to be used, the need for general anesthesia, the use of drains to postoperatively, the expected hospital stay, and the overall recovery.  I informed her of the  indications,  risk, benefits, and alternatives.  She understands the risks include, but not limited to death, MI, DVT, PE, bleeding, possible need for transfusion, infection, recurrent laryngeal and/or phrenic nerve injury, as well as possibility of other unforeseeable complications.  Plan: PET/CT to help guide initial diagnostic approach Tentatively plan for robotic left VATS, possible sternotomy for resection of anterior mediastinal mass on Monday, 08/16/2021  I spent over 45 minutes in review of records, images, and in consultation with Taylor Chen today. Melrose Nakayama, MD [facility] [phone]

## 2021-08-03 NOTE — Progress Notes (Signed)
PCP is [name], MD Referring Provider is [name], MD  Chief Complaint  Patient presents with   Mediastinal Mass    New patient consultation MR Chest 9/28, chest CT 9/14     HPI: Ms. [place]aca is sent for consultation regarding an anterior mediastinal mass.  Shaliah Shankle is a 60 year old with a past medical history significant for hypertension, heart murmur, hyperlipidemia, migraines, anxiety, thyroid nodule and a mediastinal mass.  She presented to the emergency room on 06/29/2021 with left anterior chest pain.  She described this as a burning sensation.  There was no radiation.  She was not having any shortness of breath.  Cardiac work-up was negative.  She had a CT of the chest which showed no evidence of pulmonary embolus or dissection.  She was noted to have an anterior mediastinal mass and a thyroid nodule.  She has not had any more severe episodes of the pain since then but has noted few mild episodes.  She also complains of some night sweats.  She is a lifelong non-smoker.  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  She has lost about 4 pounds over the past 3 months.  She has not had any problems with double vision or weakness.  She does complain of some difficulty swallowing and the sensation that food sticks in her throat. Zubrod Score: At the time of surgery this patient's most appropriate activity status/level should be described as: [x]     0    Normal activity, no symptoms []     1    Restricted in physical strenuous activity but ambulatory, able to do out light work []     2    Ambulatory and capable of self care, unable to do work activities, up and about >50 % of waking hours                              []     3    Only limited self care, in bed greater than 50% of waking hours []     4    Completely disabled, no self care, confined to bed or chair []     5    Moribund   Past Medical History:  Diagnosis Date   Anemia    IN HER 20'S   Anginal pain  (HCC)    LAST COUPLE DAYS   Anxiety    Asthma    CHILDHOOD   Depression    Headache    MIGRAINES   Heart murmur    HAS BEEN TOLD   History of stress test 2012   Hypertension    CONTROLLED BY DIET    Past Surgical History:  Procedure Laterality Date   DILATATION & CURETTAGE/HYSTEROSCOPY WITH MYOSURE N/A 08/25/2016   Procedure: DILATATION & CURETTAGE/HYSTEROSCOPY WITH MYOSURE;  Surgeon: Linda Hedges, DO;  Location: [facility];  Service: Gynecology;  Laterality: N/A;   HYSTOSCOPY  2010   INCISION AND DRAINAGE  1999    Family History  Problem Relation Age of Onset   Breast cancer Mother 54       No genetic testing    Social History Social History   Tobacco Use   Smoking status: Never   Smokeless tobacco: Never  Substance Use Topics   Alcohol use: No   Drug use: No    Current Outpatient Medications  Medication Sig Dispense Refill   amLODipine (NORVASC) 5 MG tablet Take by  mouth.     cetirizine (ZYRTEC) 10 MG tablet Take 10 mg by mouth daily as needed for allergies.     doxepin (SINEQUAN) 75 MG capsule Take 150 mg by mouth.     latanoprost (XALATAN) 0.005 % ophthalmic solution Place 1 drop into both eyes at bedtime.     Multiple Vitamins-Minerals (ONE-A-DAY 50 PLUS PO) Take 1 tablet by mouth daily.     pravastatin (PRAVACHOL) 40 MG tablet Take 80 mg by mouth every evening.     SUMAtriptan (IMITREX) 50 MG tablet Take 50 mg by mouth once. May repeat in 2 hours if headache persists or recurs.     No current facility-administered medications for this visit.    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 Reaction: yeast infection    Percocet [Oxycodone-Acetaminophen] Nausea And Vomiting    Review of Systems  Constitutional:  Positive for activity change and diaphoresis. Negative for appetite change and unexpected weight change.  HENT:  Positive for trouble swallowing. Negative for voice change.   Eyes:  Negative for visual disturbance.  Respiratory:  Positive for cough.   Cardiovascular:  Positive for chest pain (See HPI).  Neurological:  Negative for dizziness and seizures.  Hematological:  Bruises/bleeds easily.  Psychiatric/Behavioral:  Positive for dysphoric mood. The patient is nervous/anxious.   All other systems reviewed and are negative.  BP (!) 143/92 (BP Location: Left Arm, Patient Position: Sitting, Cuff Size: Normal)   Pulse (!) 108   Resp 20   Ht 5\' 1"  (1.549 m)   Wt 178 lb (80.7 kg)   SpO2 96% Comment: RA  BMI 33.63 kg/m  Physical Exam Vitals reviewed.  Constitutional:      General: She is not in acute distress.    Appearance: Normal appearance. She is obese.  HENT:     Head: Normocephalic and atraumatic.  Eyes:     General: No scleral icterus.    Extraocular Movements: Extraocular movements intact.  Cardiovascular:     Rate and Rhythm: Normal rate and regular rhythm.     Heart sounds: Murmur (2/6 systolic) heard.  Pulmonary:     Effort: Pulmonary effort is normal. No respiratory distress.     Breath sounds: Normal breath sounds. No wheezing or rales.  Abdominal:     General: There is no distension.     Palpations: Abdomen is soft.     Tenderness: There is no abdominal tenderness.  Musculoskeletal:     Cervical back: Neck supple.  Lymphadenopathy:     Cervical: No cervical adenopathy.   Skin:    General: Skin is warm and dry.  Neurological:     General: No focal deficit present.     Mental Status: She is alert and oriented to person, place, and time.     Cranial Nerves: No cranial nerve deficit.     Motor: No weakness.  Diagnostic Tests: CT ANGIOGRAPHY CHEST WITH CONTRAST   TECHNIQUE: Multidetector CT imaging of the chest was performed using the standard protocol during bolus administration of intravenous contrast. Multiplanar CT image reconstructions and MIPs were obtained to evaluate the vascular anatomy.   CONTRAST:  32mL OMNIPAQUE IOHEXOL 350 MG/ML SOLN   COMPARISON:  None.   FINDINGS: Cardiovascular: Satisfactory opacification of the pulmonary arteries to the segmental level. No evidence of pulmonary embolism. Normal heart size. No pericardial effusion.   Mediastinum/Nodes: No enlarged mediastinal, hilar, or axillary lymph nodes.   A 4.4 cm x 2.7 cm x 1.6 cm partially calcified soft tissue mass is seen within the anterior aspect of the superior mediastinum, to the left of midline. This extends from the level of the aortic arch to the main pulmonary artery (axial CT images 26 through 39, CT series 5).   A 2.5 cm x 1.8 cm cyst is seen within the inferior aspect of the left lobe of the thyroid gland.   The trachea and esophagus demonstrate no significant findings.   Lungs/Pleura: Very mild atelectasis is seen within the left lung base.   There is no evidence of acute infiltrate, pleural effusion or pneumothorax.   Upper Abdomen: No acute abnormality.   Musculoskeletal: No chest wall abnormality. No acute or significant osseous findings.   Review of the MIP images confirms the above findings.   IMPRESSION: 1. No evidence of pulmonary embolism or acute cardiopulmonary disease. 2. Partially calcified soft tissue mass within the anterior aspect of the superior mediastinum which may represent a thymoma. Further evaluation with nonemergent  MRI is recommended. 3. 2.5 cm x 1.8 cm cyst within the left lobe of the thyroid gland. Further evaluation with nonemergent thyroid ultrasound is recommended.     Electronically Signed   By: Virgina Norfolk M.D.   On: 06/30/2021 01:21 I personally reviewed the CT images.  There is a 4.4 x 2.8 x 1.5 cm mass in the anterior mediastinum on the left.  There is some internal calcification.  Also a cystic thyroid nodule on the left side.  Beta-hCG < 1 alpha-fetoprotein= 2.2  Impression: Lanijah Coppens 60 year old woman with a past medical history significant for hypertension, heart murmur, hyperlipidemia, migraines, anxiety, thyroid nodule and a mediastinal mass.  Anterior mediastinal mass-some internal calcification.  Differential diagnosis is primarily thymoma or thymic carcinoma versus germ cell tumors.  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  Lymphoma less likely.  Alpha-fetoprotein and beta-hCG were negative.  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  I discussed the differential diagnosis with Ms. Sartwell.  We discussed options of eventually trying to biopsy this versus just proceeding with surgical resection.  I favor proceeding with surgical resection.  This can be done either via a sternotomy or robotic left VATS approach.  We discussed the relative advantages and disadvantages of each of those approaches.  I do think she needs a PET/CT prior to proceeding with surgery.  Findings on that could influence which approach we decide to use.  I described both approaches to her.  Depending on PET findings my preferred approach would be robotic left VATS.  She understands there is possibility we might have to convert to a sternotomy.  I informed her of the general nature of the procedure including the incisions to be used, the need for general anesthesia, the use of drains to postoperatively, the expected hospital stay, and the overall recovery.  I informed her of the  indications,  risk, benefits, and alternatives.  She understands the risks include, but not limited to death, MI, DVT, PE, bleeding, possible need for transfusion, infection, recurrent laryngeal and/or phrenic nerve injury, as well as possibility of other unforeseeable complications.  Plan: PET/CT to help guide initial diagnostic approach Tentatively plan for robotic left VATS, possible sternotomy for resection of anterior mediastinal mass on Monday, 08/16/2021  I spent over 45 minutes in review of records, images, and in consultation with Ms. Coe today. Melrose Nakayama, MD [facility] [phone]

## 2021-08-04 ENCOUNTER — Encounter: Payer: Self-pay | Admitting: *Deleted

## 2021-08-06 DIAGNOSIS — F411 Generalized anxiety disorder: Secondary | ICD-10-CM | POA: Diagnosis not present

## 2021-08-09 DIAGNOSIS — F411 Generalized anxiety disorder: Secondary | ICD-10-CM | POA: Diagnosis not present

## 2021-08-11 NOTE — Pre-Procedure Instructions (Signed)
Surgical Instructions    Your procedure is scheduled on Monday, October 31st.  Report to [facility] Main Entrance "A" at 5:30 A.M., then check in with the Admitting office.  Call this number if you have problems the morning of surgery:  [phone]   If you have any questions prior to your surgery date call [phone]: Open Monday-Friday 8am-4pm    Remember:  Do not eat or drink after midnight the night before your surgery   Take these medicines the morning of surgery with A SIP OF WATER  amLODipine (NORVASC)  cetirizine (ZYRTEC)-as needed SUMAtriptan (IMITREX) -as needed  Follow your surgeon's instructions on when to stop Aspirin.  If no instructions were given by your surgeon then you will need to call the office to get those instructions.    As of today, STOP taking any Aleve, Naproxen, Ibuprofen, Motrin, Advil, Goody's, BC's, all herbal medications, fish oil, and all vitamins.                     Do NOT Smoke (Tobacco/Vaping) or drink Alcohol 24 hours prior to your procedure.  If you use a CPAP at night, you may bring all equipment for your overnight stay.   Contacts, glasses, piercing's, hearing aid's, dentures or partials may not be worn into surgery, please bring cases for these belongings.    For patients admitted to the hospital, discharge time will be determined by your treatment team.   Patients discharged the day of surgery will not be allowed to drive home, and someone needs to stay with them for 24 hours.  NO VISITORS WILL BE ALLOWED IN PRE-OP WHERE PATIENTS GET READY FOR SURGERY.  ONLY 1 SUPPORT PERSON MAY BE PRESENT IN THE WAITING ROOM WHILE YOU ARE IN SURGERY.  IF YOU ARE TO BE ADMITTED, ONCE YOU ARE IN YOUR ROOM YOU WILL BE ALLOWED TWO (2) VISITORS.  Minor children may have two parents present. Special consideration for safety and communication needs will be reviewed on a case by case basis.   Special instructions:   [place]- Preparing For  Surgery  Before surgery, you can play an important role. Because skin is not sterile, your skin needs to be as free of germs as possible. You can reduce the number of germs on your skin by washing with CHG (chlorahexidine gluconate) Soap before surgery.  CHG is an antiseptic cleaner which kills germs and bonds with the skin to continue killing germs even after washing.    Oral Hygiene is also important to reduce your risk of infection.  Remember - BRUSH YOUR TEETH THE MORNING OF SURGERY WITH YOUR REGULAR TOOTHPASTE  Please do not use if you have an allergy to CHG or antibacterial soaps. If your skin becomes reddened/irritated stop using the CHG.  Do not shave (including legs and underarms) for at least 48 hours prior to first CHG shower. It is OK to shave your face.  Please follow these instructions carefully.   Shower the NIGHT BEFORE SURGERY and the MORNING OF SURGERY  If you chose to wash your hair, wash your hair first as usual with your normal shampoo.  After you shampoo, rinse your hair and body thoroughly to remove the shampoo.  Use CHG Soap as you would any other liquid soap. You can apply CHG directly to the skin and wash gently with a scrungie or a clean washcloth.   Apply the CHG Soap to your body ONLY FROM THE NECK DOWN.  Do not use  on open wounds or open sores. Avoid contact with your eyes, ears, mouth and genitals (private parts). Wash Face and genitals (private parts)  with your normal soap.   Wash thoroughly, paying special attention to the area where your surgery will be performed.  Thoroughly rinse your body with warm water from the neck down.  DO NOT shower/wash with your normal soap after using and rinsing off the CHG Soap.  Pat yourself dry with a CLEAN TOWEL.  Wear CLEAN PAJAMAS to bed the night before surgery  Place CLEAN SHEETS on your bed the night before your surgery  DO NOT SLEEP WITH PETS.   Day of Surgery: Shower with CHG soap. Do not wear jewelry,  make up, nail polish, gel polish, artificial nails, or any other type of covering on natural nails including finger and toenails. If patients have artificial nails, gel coating, etc. that need to be removed by a nail salon please have this removed prior to surgery. Surgery may need to be canceled/delayed if the surgeon/ anesthesia feels like the patient is unable to be adequately monitored. Do not wear lotions, powders, perfumes, or deodorant. Do not shave 48 hours prior to surgery.   Do not bring valuables to the hospital. [facility] is not responsible for any belongings or valuables. Wear Clean/Comfortable clothing the morning of surgery Remember to brush your teeth WITH YOUR REGULAR TOOTHPASTE.   Please read over the following fact sheets that you were given.   3 days prior to your procedure or After your COVID test   You are not required to quarantine however you are required to wear a well-fitting mask when you are out and around people not in your household. If your mask becomes wet or soiled, replace with a new one.   Wash your hands often with soap and water for 20 seconds or clean your hands with an alcohol-based hand sanitizer that contains at least 60% alcohol.   Do not share personal items.   Notify your provider:  o if you are in close contact with someone who has COVID  o or if you develop a fever of 100.4 or greater, sneezing, cough, sore throat, shortness of breath or body aches.

## 2021-08-12 ENCOUNTER — Encounter (HOSPITAL_COMMUNITY): Payer: Self-pay

## 2021-08-12 ENCOUNTER — Encounter (HOSPITAL_COMMUNITY)
Admission: RE | Admit: 2021-08-12 | Discharge: 2021-08-12 | Disposition: A | Discharge status: Home / Self Care | Payer: BC Managed Care – PPO | Source: Ambulatory Visit | Attending: Thoracic Surgery (Cardiothoracic Vascular Surgery) | Admitting: Thoracic Surgery (Cardiothoracic Vascular Surgery)

## 2021-08-12 ENCOUNTER — Ambulatory Visit
Admission: RE | Admit: 2021-08-12 | Discharge: 2021-08-12 | Disposition: A | Discharge status: Home / Self Care | Payer: BC Managed Care – PPO | Source: Ambulatory Visit | Attending: Family Medicine | Admitting: Family Medicine

## 2021-08-12 ENCOUNTER — Other Ambulatory Visit (HOSPITAL_COMMUNITY)
Admission: RE | Admit: 2021-08-12 | Discharge: 2021-08-12 | Disposition: A | Discharge status: Home / Self Care | Payer: BC Managed Care – PPO | Source: Ambulatory Visit | Attending: Radiology | Admitting: Radiology

## 2021-08-12 ENCOUNTER — Ambulatory Visit (HOSPITAL_COMMUNITY)
Admission: RE | Admit: 2021-08-12 | Discharge: 2021-08-12 | Disposition: A | Discharge status: Home / Self Care | Payer: BC Managed Care – PPO | Source: Ambulatory Visit | Attending: Thoracic Surgery (Cardiothoracic Vascular Surgery) | Admitting: Thoracic Surgery (Cardiothoracic Vascular Surgery)

## 2021-08-12 ENCOUNTER — Other Ambulatory Visit: Payer: Self-pay

## 2021-08-12 VITALS — BP 121/71 | HR 104 | Temp 98.4°F | Resp 18 | Ht 61.0 in | Wt 181.9 lb

## 2021-08-12 DIAGNOSIS — Z01818 Encounter for other preprocedural examination: Secondary | ICD-10-CM

## 2021-08-12 DIAGNOSIS — D34 Benign neoplasm of thyroid gland: Secondary | ICD-10-CM | POA: Diagnosis not present

## 2021-08-12 DIAGNOSIS — J9859 Other diseases of mediastinum, not elsewhere classified: Secondary | ICD-10-CM

## 2021-08-12 DIAGNOSIS — R222 Localized swelling, mass and lump, trunk: Secondary | ICD-10-CM | POA: Diagnosis not present

## 2021-08-12 DIAGNOSIS — Z20822 Contact with and (suspected) exposure to covid-19: Secondary | ICD-10-CM | POA: Insufficient documentation

## 2021-08-12 DIAGNOSIS — E041 Nontoxic single thyroid nodule: Secondary | ICD-10-CM | POA: Diagnosis not present

## 2021-08-12 DIAGNOSIS — E079 Disorder of thyroid, unspecified: Secondary | ICD-10-CM

## 2021-08-12 HISTORY — DX: Prediabetes: R73.03

## 2021-08-12 LAB — BLOOD GAS, ARTERIAL
Acid-base deficit: 0.1 mmol/L (ref 0.0–2.0)
Bicarbonate: 24.1 mmol/L (ref 20.0–28.0)
Drawn by: 602861
FIO2: 21
O2 Saturation: 98 %
Patient temperature: 37
pCO2 arterial: 39.7 mmHg (ref 32.0–48.0)
pH, Arterial: 7.4 (ref 7.350–7.450)
pO2, Arterial: 107 mmHg (ref 83.0–108.0)

## 2021-08-12 LAB — COMPREHENSIVE METABOLIC PANEL
ALT: 33 U/L (ref 0–44)
AST: 25 U/L (ref 15–41)
Albumin: 4 g/dL (ref 3.5–5.0)
Alkaline Phosphatase: 63 U/L (ref 38–126)
Anion gap: 8 (ref 5–15)
BUN: 15 mg/dL (ref 6–20)
CO2: 22 mmol/L (ref 22–32)
Calcium: 9.2 mg/dL (ref 8.9–10.3)
Chloride: 105 mmol/L (ref 98–111)
Creatinine, Ser: 1.16 mg/dL — ABNORMAL HIGH (ref 0.44–1.00)
GFR, Estimated: 54 mL/min — ABNORMAL LOW (ref >60)
Glucose, Bld: 95 mg/dL (ref 70–99)
Potassium: 3.7 mmol/L (ref 3.5–5.1)
Sodium: 135 mmol/L (ref 135–145)
Total Bilirubin: 0.7 mg/dL (ref 0.3–1.2)
Total Protein: 7.2 g/dL (ref 6.5–8.1)

## 2021-08-12 LAB — URINALYSIS, ROUTINE W REFLEX MICROSCOPIC
Bilirubin Urine: NEGATIVE
Glucose, UA: NEGATIVE mg/dL
Hgb urine dipstick: NEGATIVE
Ketones, ur: NEGATIVE mg/dL
Nitrite: NEGATIVE
Protein, ur: NEGATIVE mg/dL
Specific Gravity, Urine: 1.009 (ref 1.005–1.030)
pH: 5 (ref 5.0–8.0)

## 2021-08-12 LAB — SURGICAL PCR SCREEN
MRSA, PCR: NEGATIVE
Staphylococcus aureus: NEGATIVE

## 2021-08-12 LAB — APTT: aPTT: 28 seconds (ref 24–36)

## 2021-08-12 LAB — CBC
HCT: 35.4 % — ABNORMAL LOW (ref 36.0–46.0)
Hemoglobin: 11.4 g/dL — ABNORMAL LOW (ref 12.0–15.0)
MCH: 30 pg (ref 26.0–34.0)
MCHC: 32.2 g/dL (ref 30.0–36.0)
MCV: 93.2 fL (ref 80.0–100.0)
Platelets: 348 10*3/uL (ref 150–400)
RBC: 3.8 MIL/uL — ABNORMAL LOW (ref 3.87–5.11)
RDW: 14.1 % (ref 11.5–15.5)
WBC: 6.4 10*3/uL (ref 4.0–10.5)
nRBC: 0 % (ref 0.0–0.2)

## 2021-08-12 LAB — SARS CORONAVIRUS 2 (TAT 6-24 HRS): SARS Coronavirus 2: NEGATIVE

## 2021-08-12 LAB — PROTIME-INR
INR: 0.9 (ref 0.8–1.2)
Prothrombin Time: 12.6 seconds (ref 11.4–15.2)

## 2021-08-12 NOTE — Progress Notes (Signed)
PCP - Dr. Yaakov Guthrie Cardiologist - denies  PPM/ICD - denies   Chest x-ray - 08/12/21 EKG - 08/12/21 Stress Test - 2013 ECHO - 2013 Cardiac Cath - denies  Sleep Study - denies   DM- denies  Blood Thinner Instructions: n/a Aspirin Instructions: pt instructed to hold ASA starting today  ERAS Protcol - no, NPO   COVID TEST- 08/12/21 at PAT   Anesthesia review: no   Patient denies shortness of breath, fever, cough and chest pain at PAT appointment   All instructions explained to the patient, with a verbal understanding of the material. Patient agrees to go over the instructions while at home for a better understanding. Patient also instructed to wear a mask in public after being tested for COVID-19. The opportunity to ask questions was provided.

## 2021-08-15 NOTE — Anesthesia Preprocedure Evaluation (Addendum)
Anesthesia Evaluation  Patient identified by MRN, date of birth, ID band Patient awake    Reviewed: Allergy & Precautions, H&P , NPO status , Patient's Chart, lab work & pertinent test results  Airway Mallampati: II  TM Distance: >3 FB Neck ROM: Full    Dental no notable dental hx. (+) Teeth Intact, Dental Advisory Given   Pulmonary asthma ,    Pulmonary exam normal breath sounds clear to auscultation       Cardiovascular Exercise Tolerance: Good hypertension, Pt. on medications  Rhythm:Regular Rate:Normal     Neuro/Psych  Headaches, Anxiety Depression    GI/Hepatic negative GI ROS, Neg liver ROS,   Endo/Other  negative endocrine ROS  Renal/GU negative Renal ROS  negative genitourinary   Musculoskeletal   Abdominal   Peds  Hematology  (+) Blood dyscrasia, anemia ,   Anesthesia Other Findings   Reproductive/Obstetrics negative OB ROS                            Anesthesia Physical Anesthesia Plan  ASA: 3  Anesthesia Plan: General   Post-op Pain Management:    Induction: Intravenous  PONV Risk Score and Plan: 4 or greater and Ondansetron, Dexamethasone and Midazolam  Airway Management Planned: Double Lumen EBT  Additional Equipment: Arterial line, CVP and Ultrasound Guidance Line Placement  Intra-op Plan:   Post-operative Plan: Extubation in OR  Informed Consent: I have reviewed the patients History and Physical, chart, labs and discussed the procedure including the risks, benefits and alternatives for the proposed anesthesia with the patient or authorized representative who has indicated his/her understanding and acceptance.     Dental advisory given  Plan Discussed with: CRNA  Anesthesia Plan Comments:        Anesthesia Quick Evaluation

## 2021-08-16 ENCOUNTER — Other Ambulatory Visit: Payer: Self-pay

## 2021-08-16 ENCOUNTER — Encounter (HOSPITAL_COMMUNITY): Payer: Self-pay | Admitting: Thoracic Surgery (Cardiothoracic Vascular Surgery)

## 2021-08-16 ENCOUNTER — Inpatient Hospital Stay (HOSPITAL_COMMUNITY): Payer: BC Managed Care – PPO | Admitting: Physician Assistant

## 2021-08-16 ENCOUNTER — Inpatient Hospital Stay (HOSPITAL_COMMUNITY)
Admission: RE | Admit: 2021-08-16 | Discharge: 2021-08-18 | DRG: 827 | Disposition: A | Discharge status: Home / Self Care | Payer: BC Managed Care – PPO | Attending: Thoracic Surgery (Cardiothoracic Vascular Surgery) | Admitting: Thoracic Surgery (Cardiothoracic Vascular Surgery)

## 2021-08-16 ENCOUNTER — Inpatient Hospital Stay (HOSPITAL_COMMUNITY): Payer: BC Managed Care – PPO

## 2021-08-16 ENCOUNTER — Inpatient Hospital Stay (HOSPITAL_COMMUNITY): Payer: BC Managed Care – PPO | Admitting: Anesthesiology

## 2021-08-16 ENCOUNTER — Encounter (HOSPITAL_COMMUNITY)
Admission: RE | Disposition: A | Discharge status: Home / Self Care | Payer: Self-pay | Source: Home / Self Care | Attending: Thoracic Surgery (Cardiothoracic Vascular Surgery)

## 2021-08-16 DIAGNOSIS — D62 Acute posthemorrhagic anemia: Secondary | ICD-10-CM | POA: Diagnosis not present

## 2021-08-16 DIAGNOSIS — J9811 Atelectasis: Secondary | ICD-10-CM | POA: Diagnosis present

## 2021-08-16 DIAGNOSIS — E041 Nontoxic single thyroid nodule: Secondary | ICD-10-CM | POA: Diagnosis not present

## 2021-08-16 DIAGNOSIS — Z885 Allergy status to narcotic agent status: Secondary | ICD-10-CM | POA: Diagnosis not present

## 2021-08-16 DIAGNOSIS — Z880 Allergy status to penicillin: Secondary | ICD-10-CM

## 2021-08-16 DIAGNOSIS — Z803 Family history of malignant neoplasm of breast: Secondary | ICD-10-CM

## 2021-08-16 DIAGNOSIS — Z79899 Other long term (current) drug therapy: Secondary | ICD-10-CM | POA: Diagnosis not present

## 2021-08-16 DIAGNOSIS — R918 Other nonspecific abnormal finding of lung field: Secondary | ICD-10-CM | POA: Diagnosis not present

## 2021-08-16 DIAGNOSIS — G43909 Migraine, unspecified, not intractable, without status migrainosus: Secondary | ICD-10-CM | POA: Diagnosis not present

## 2021-08-16 DIAGNOSIS — R7303 Prediabetes: Secondary | ICD-10-CM | POA: Diagnosis not present

## 2021-08-16 DIAGNOSIS — E785 Hyperlipidemia, unspecified: Secondary | ICD-10-CM | POA: Diagnosis not present

## 2021-08-16 DIAGNOSIS — Z4682 Encounter for fitting and adjustment of non-vascular catheter: Secondary | ICD-10-CM | POA: Diagnosis not present

## 2021-08-16 DIAGNOSIS — C37 Malignant neoplasm of thymus: Principal | ICD-10-CM | POA: Diagnosis present

## 2021-08-16 DIAGNOSIS — R222 Localized swelling, mass and lump, trunk: Secondary | ICD-10-CM | POA: Diagnosis not present

## 2021-08-16 DIAGNOSIS — I10 Essential (primary) hypertension: Secondary | ICD-10-CM | POA: Diagnosis not present

## 2021-08-16 DIAGNOSIS — R011 Cardiac murmur, unspecified: Secondary | ICD-10-CM | POA: Diagnosis not present

## 2021-08-16 DIAGNOSIS — Z888 Allergy status to other drugs, medicaments and biological substances status: Secondary | ICD-10-CM

## 2021-08-16 DIAGNOSIS — D383 Neoplasm of uncertain behavior of mediastinum: Secondary | ICD-10-CM | POA: Diagnosis not present

## 2021-08-16 DIAGNOSIS — K660 Peritoneal adhesions (postprocedural) (postinfection): Secondary | ICD-10-CM | POA: Diagnosis not present

## 2021-08-16 DIAGNOSIS — F418 Other specified anxiety disorders: Secondary | ICD-10-CM | POA: Diagnosis not present

## 2021-08-16 DIAGNOSIS — J189 Pneumonia, unspecified organism: Secondary | ICD-10-CM | POA: Diagnosis not present

## 2021-08-16 DIAGNOSIS — J9859 Other diseases of mediastinum, not elsewhere classified: Secondary | ICD-10-CM | POA: Diagnosis not present

## 2021-08-16 HISTORY — PX: LYMPH NODE BIOPSY: SHX201

## 2021-08-16 LAB — PREPARE RBC (CROSSMATCH)

## 2021-08-16 LAB — CYTOLOGY - NON PAP

## 2021-08-16 SURGERY — EXCISION, MASS, MEDIASTINUM, ROBOT-ASSISTED
Anesthesia: General

## 2021-08-16 MED ORDER — TRAMADOL HCL 50 MG PO TABS: 50.0000 mg | ORAL_TABLET | Freq: Four times a day (QID) | ORAL | Status: DC | PRN

## 2021-08-16 MED ORDER — SENNOSIDES-DOCUSATE SODIUM 8.6-50 MG PO TABS
1.0000 | ORAL_TABLET | Freq: Every day | ORAL | Status: DC
  Administered 2021-08-16 – 2021-08-17 (×2): 1 via ORAL
  Filled 2021-08-16 (×2): qty 1

## 2021-08-16 MED ORDER — LACTATED RINGERS IV SOLN: INTRAVENOUS | Status: DC | PRN

## 2021-08-16 MED ORDER — ALBUTEROL SULFATE HFA 108 (90 BASE) MCG/ACT IN AERS
INHALATION_SPRAY | RESPIRATORY_TRACT | Status: DC | PRN
  Administered 2021-08-16: 6 via RESPIRATORY_TRACT

## 2021-08-16 MED ORDER — 0.9 % SODIUM CHLORIDE (POUR BTL) OPTIME
TOPICAL | Status: DC | PRN
  Administered 2021-08-16: 2000 mL

## 2021-08-16 MED ORDER — ONDANSETRON HCL 4 MG/2ML IJ SOLN
INTRAMUSCULAR | Status: DC | PRN
Start: 2021-08-16 — End: 2021-08-16
  Administered 2021-08-16: 4 mg via INTRAVENOUS

## 2021-08-16 MED ORDER — HYDROMORPHONE HCL 1 MG/ML IJ SOLN
INTRAMUSCULAR | Status: AC
  Filled 2021-08-16: qty 1

## 2021-08-16 MED ORDER — PHENYLEPHRINE HCL (PRESSORS) 10 MG/ML IV SOLN
INTRAVENOUS | Status: DC | PRN
  Administered 2021-08-16: 80 ug via INTRAVENOUS
  Administered 2021-08-16: 120 ug via INTRAVENOUS
  Administered 2021-08-16: 80 ug via INTRAVENOUS
  Administered 2021-08-16 (×3): 120 ug via INTRAVENOUS
  Administered 2021-08-16 (×2): 80 ug via INTRAVENOUS

## 2021-08-16 MED ORDER — ONDANSETRON HCL 4 MG/2ML IJ SOLN
INTRAMUSCULAR | Status: AC
  Filled 2021-08-16: qty 2

## 2021-08-16 MED ORDER — LATANOPROST 0.005 % OP SOLN
1.0000 [drp] | Freq: Every day | OPHTHALMIC | Status: DC
  Administered 2021-08-16 – 2021-08-17 (×2): 1 [drp] via OPHTHALMIC
  Filled 2021-08-16: qty 2.5

## 2021-08-16 MED ORDER — SUGAMMADEX SODIUM 200 MG/2ML IV SOLN
INTRAVENOUS | Status: DC | PRN
  Administered 2021-08-16: 200 mg via INTRAVENOUS

## 2021-08-16 MED ORDER — LACTATED RINGERS IV SOLN: INTRAVENOUS | Status: DC

## 2021-08-16 MED ORDER — ALBUTEROL SULFATE HFA 108 (90 BASE) MCG/ACT IN AERS
INHALATION_SPRAY | RESPIRATORY_TRACT | Status: AC
  Filled 2021-08-16: qty 6.7

## 2021-08-16 MED ORDER — SODIUM CHLORIDE 0.9% IV SOLUTION: Freq: Once | INTRAVENOUS | Status: DC

## 2021-08-16 MED ORDER — DEXAMETHASONE SODIUM PHOSPHATE 10 MG/ML IJ SOLN
INTRAMUSCULAR | Status: AC
  Filled 2021-08-16: qty 1

## 2021-08-16 MED ORDER — ACETAMINOPHEN 500 MG PO TABS: 1000.0000 mg | ORAL_TABLET | Freq: Once | ORAL | Status: AC

## 2021-08-16 MED ORDER — ACETAMINOPHEN 500 MG PO TABS
ORAL_TABLET | ORAL | Status: AC
  Administered 2021-08-16: 1000 mg via ORAL
  Filled 2021-08-16: qty 2

## 2021-08-16 MED ORDER — SODIUM CHLORIDE 0.9 % IV SOLN
12.5000 mg | Freq: Four times a day (QID) | INTRAVENOUS | Status: DC | PRN
  Administered 2021-08-16: 12.5 mg via INTRAVENOUS
  Filled 2021-08-16: qty 0.5

## 2021-08-16 MED ORDER — PHENYLEPHRINE HCL-NACL 20-0.9 MG/250ML-% IV SOLN
INTRAVENOUS | Status: DC | PRN
  Administered 2021-08-16: 30 ug/min via INTRAVENOUS

## 2021-08-16 MED ORDER — ATORVASTATIN CALCIUM 10 MG PO TABS
20.0000 mg | ORAL_TABLET | Freq: Every day | ORAL | Status: DC
  Administered 2021-08-17: 20 mg via ORAL
  Filled 2021-08-16: qty 2

## 2021-08-16 MED ORDER — FENTANYL CITRATE (PF) 250 MCG/5ML IJ SOLN
INTRAMUSCULAR | Status: AC
  Filled 2021-08-16: qty 5

## 2021-08-16 MED ORDER — FENTANYL CITRATE (PF) 100 MCG/2ML IJ SOLN
INTRAMUSCULAR | Status: DC | PRN
  Administered 2021-08-16: 100 ug via INTRAVENOUS
  Administered 2021-08-16 (×2): 50 ug via INTRAVENOUS

## 2021-08-16 MED ORDER — ROCURONIUM BROMIDE 10 MG/ML (PF) SYRINGE
PREFILLED_SYRINGE | INTRAVENOUS | Status: AC
  Filled 2021-08-16: qty 10

## 2021-08-16 MED ORDER — SODIUM CHLORIDE 0.9 % IV SOLN: INTRAVENOUS | Status: DC

## 2021-08-16 MED ORDER — MIDAZOLAM HCL 2 MG/2ML IJ SOLN
INTRAMUSCULAR | Status: AC
  Filled 2021-08-16: qty 2

## 2021-08-16 MED ORDER — VANCOMYCIN HCL IN DEXTROSE 1-5 GM/200ML-% IV SOLN
INTRAVENOUS | Status: AC
  Filled 2021-08-16: qty 200

## 2021-08-16 MED ORDER — EPHEDRINE 5 MG/ML INJ
INTRAVENOUS | Status: AC
  Filled 2021-08-16: qty 10

## 2021-08-16 MED ORDER — ONDANSETRON HCL 4 MG/2ML IJ SOLN
4.0000 mg | Freq: Four times a day (QID) | INTRAMUSCULAR | Status: DC | PRN
  Administered 2021-08-16: 4 mg via INTRAVENOUS
  Filled 2021-08-16: qty 2

## 2021-08-16 MED ORDER — CHLORHEXIDINE GLUCONATE 0.12 % MT SOLN: 15.0000 mL | Freq: Once | OROMUCOSAL | Status: AC

## 2021-08-16 MED ORDER — AMLODIPINE BESYLATE 5 MG PO TABS
7.5000 mg | ORAL_TABLET | Freq: Every day | ORAL | Status: DC
  Administered 2021-08-17 – 2021-08-18 (×2): 7.5 mg via ORAL
  Filled 2021-08-16 (×2): qty 1

## 2021-08-16 MED ORDER — VANCOMYCIN HCL IN DEXTROSE 1-5 GM/200ML-% IV SOLN
1000.0000 mg | Freq: Two times a day (BID) | INTRAVENOUS | Status: AC
  Administered 2021-08-16: 1000 mg via INTRAVENOUS
  Filled 2021-08-16: qty 200

## 2021-08-16 MED ORDER — DOXEPIN HCL 50 MG PO CAPS
150.0000 mg | ORAL_CAPSULE | Freq: Every day | ORAL | Status: DC
  Administered 2021-08-18: 150 mg via ORAL
  Filled 2021-08-16: qty 2
  Filled 2021-08-16: qty 3
  Filled 2021-08-16 (×2): qty 2

## 2021-08-16 MED ORDER — BUPIVACAINE LIPOSOME 1.3 % IJ SUSP
INTRAMUSCULAR | Status: AC
  Filled 2021-08-16: qty 20

## 2021-08-16 MED ORDER — ASPIRIN EC 81 MG PO TBEC
81.0000 mg | DELAYED_RELEASE_TABLET | Freq: Every day | ORAL | Status: DC
  Administered 2021-08-17 – 2021-08-18 (×2): 81 mg via ORAL
  Filled 2021-08-16 (×2): qty 1

## 2021-08-16 MED ORDER — HYDROMORPHONE HCL 1 MG/ML IJ SOLN
0.2500 mg | INTRAMUSCULAR | Status: DC | PRN
  Administered 2021-08-16 (×3): 0.5 mg via INTRAVENOUS

## 2021-08-16 MED ORDER — ENOXAPARIN SODIUM 40 MG/0.4ML IJ SOSY
40.0000 mg | PREFILLED_SYRINGE | Freq: Every day | INTRAMUSCULAR | Status: DC
  Administered 2021-08-16 – 2021-08-17 (×2): 40 mg via SUBCUTANEOUS
  Filled 2021-08-16 (×2): qty 0.4

## 2021-08-16 MED ORDER — PHENYLEPHRINE 40 MCG/ML (10ML) SYRINGE FOR IV PUSH (FOR BLOOD PRESSURE SUPPORT)
PREFILLED_SYRINGE | INTRAVENOUS | Status: AC
  Filled 2021-08-16: qty 20

## 2021-08-16 MED ORDER — EPHEDRINE SULFATE-NACL 50-0.9 MG/10ML-% IV SOSY
PREFILLED_SYRINGE | INTRAVENOUS | Status: DC | PRN
  Administered 2021-08-16: 10 mg via INTRAVENOUS
  Administered 2021-08-16 (×2): 5 mg via INTRAVENOUS
  Administered 2021-08-16: 10 mg via INTRAVENOUS

## 2021-08-16 MED ORDER — MIDAZOLAM HCL 5 MG/5ML IJ SOLN
INTRAMUSCULAR | Status: DC | PRN
  Administered 2021-08-16: 2 mg via INTRAVENOUS

## 2021-08-16 MED ORDER — BISACODYL 5 MG PO TBEC
10.0000 mg | DELAYED_RELEASE_TABLET | Freq: Every day | ORAL | Status: DC
  Administered 2021-08-17: 10 mg via ORAL
  Filled 2021-08-16: qty 2

## 2021-08-16 MED ORDER — EPHEDRINE 5 MG/ML INJ
INTRAVENOUS | Status: AC
  Filled 2021-08-16: qty 5

## 2021-08-16 MED ORDER — METOCLOPRAMIDE HCL 5 MG/ML IJ SOLN
10.0000 mg | Freq: Four times a day (QID) | INTRAMUSCULAR | Status: AC
  Administered 2021-08-16 – 2021-08-17 (×4): 10 mg via INTRAVENOUS
  Filled 2021-08-16 (×4): qty 2

## 2021-08-16 MED ORDER — ACETAMINOPHEN 500 MG PO TABS
1000.0000 mg | ORAL_TABLET | Freq: Four times a day (QID) | ORAL | Status: DC
  Administered 2021-08-16 – 2021-08-17 (×4): 1000 mg via ORAL
  Filled 2021-08-16 (×5): qty 2

## 2021-08-16 MED ORDER — DEXAMETHASONE SODIUM PHOSPHATE 10 MG/ML IJ SOLN
INTRAMUSCULAR | Status: DC | PRN
Start: 2021-08-16 — End: 2021-08-16
  Administered 2021-08-16: 10 mg via INTRAVENOUS

## 2021-08-16 MED ORDER — PROPOFOL 10 MG/ML IV BOLUS
INTRAVENOUS | Status: AC
  Filled 2021-08-16: qty 20

## 2021-08-16 MED ORDER — VANCOMYCIN HCL IN DEXTROSE 1-5 GM/200ML-% IV SOLN
1000.0000 mg | INTRAVENOUS | Status: AC
  Administered 2021-08-16: 1000 mg via INTRAVENOUS

## 2021-08-16 MED ORDER — ORAL CARE MOUTH RINSE: 15.0000 mL | Freq: Once | OROMUCOSAL | Status: AC

## 2021-08-16 MED ORDER — ROCURONIUM BROMIDE 10 MG/ML (PF) SYRINGE
PREFILLED_SYRINGE | INTRAVENOUS | Status: DC | PRN
  Administered 2021-08-16: 30 mg via INTRAVENOUS
  Administered 2021-08-16: 70 mg via INTRAVENOUS
  Administered 2021-08-16: 20 mg via INTRAVENOUS

## 2021-08-16 MED ORDER — CHLORHEXIDINE GLUCONATE 0.12 % MT SOLN
OROMUCOSAL | Status: AC
  Administered 2021-08-16: 15 mL via OROMUCOSAL
  Filled 2021-08-16: qty 15

## 2021-08-16 MED ORDER — PROPOFOL 10 MG/ML IV BOLUS
INTRAVENOUS | Status: DC | PRN
  Administered 2021-08-16: 50 mg via INTRAVENOUS
  Administered 2021-08-16: 150 mg via INTRAVENOUS

## 2021-08-16 MED ORDER — ACETAMINOPHEN 160 MG/5ML PO SOLN
1000.0000 mg | Freq: Four times a day (QID) | ORAL | Status: DC
  Administered 2021-08-17 – 2021-08-18 (×3): 1000 mg via ORAL
  Filled 2021-08-16 (×3): qty 40.6

## 2021-08-16 MED ORDER — MORPHINE SULFATE (PF) 2 MG/ML IV SOLN
2.0000 mg | INTRAVENOUS | Status: DC | PRN
  Administered 2021-08-16 – 2021-08-17 (×2): 2 mg via INTRAVENOUS
  Filled 2021-08-16 (×2): qty 1

## 2021-08-16 MED ORDER — BUPIVACAINE HCL (PF) 0.5 % IJ SOLN
INTRAMUSCULAR | Status: AC
  Filled 2021-08-16: qty 30

## 2021-08-16 MED ORDER — VASOPRESSIN 20 UNIT/ML IV SOLN
INTRAVENOUS | Status: AC
  Filled 2021-08-16: qty 1

## 2021-08-16 MED ORDER — KETOROLAC TROMETHAMINE 15 MG/ML IJ SOLN
30.0000 mg | Freq: Four times a day (QID) | INTRAMUSCULAR | Status: AC
Start: 2021-08-16 — End: 2021-08-18
  Administered 2021-08-16 – 2021-08-18 (×8): 30 mg via INTRAVENOUS
  Filled 2021-08-16 (×9): qty 2

## 2021-08-16 SURGICAL SUPPLY — 76 items
BLADE STERNUM SYSTEM 6 (BLADE) IMPLANT
CANNULA REDUC XI 12-8 STAPL (CANNULA) ×1
CANNULA REDUCER 12-8 DVNC XI (CANNULA) ×3 IMPLANT
CATH THORACIC 36FR (CATHETERS) IMPLANT
CATH THORACIC 36FR RT ANG (CATHETERS) IMPLANT
CNTNR URN SCR LID CUP LEK RST (MISCELLANEOUS) ×3 IMPLANT
CONN ST 1/4X3/8  BEN (MISCELLANEOUS) ×1
CONN ST 1/4X3/8 BEN (MISCELLANEOUS) ×3 IMPLANT
CONT SPEC 4OZ STRL OR WHT (MISCELLANEOUS) ×1
COVER TIP SHEARS 8 DVNC (MISCELLANEOUS) IMPLANT
COVER TIP SHEARS 8MM DA VINCI (MISCELLANEOUS)
DEFOGGER SCOPE WARMER CLEARIFY (MISCELLANEOUS) ×4 IMPLANT
DERMABOND ADVANCED (GAUZE/BANDAGES/DRESSINGS) ×1
DERMABOND ADVANCED .7 DNX12 (GAUZE/BANDAGES/DRESSINGS) ×3 IMPLANT
DRAPE ARM DVNC X/XI (DISPOSABLE) ×15 IMPLANT
DRAPE COLUMN DVNC XI (DISPOSABLE) ×3 IMPLANT
DRAPE CV SPLIT W-CLR ANES SCRN (DRAPES) ×4 IMPLANT
DRAPE DA VINCI XI ARM (DISPOSABLE) ×5
DRAPE DA VINCI XI COLUMN (DISPOSABLE) ×1
DRAPE HALF SHEET 40X57 (DRAPES) ×8 IMPLANT
DRAPE ORTHO SPLIT 77X108 STRL (DRAPES) ×1
DRAPE SURG ORHT 6 SPLT 77X108 (DRAPES) ×3 IMPLANT
DRSG AQUACEL AG ADV 3.5X14 (GAUZE/BANDAGES/DRESSINGS) IMPLANT
ELECT REM PT RETURN 9FT ADLT (ELECTROSURGICAL) ×4
ELECTRODE REM PT RTRN 9FT ADLT (ELECTROSURGICAL) ×3 IMPLANT
FELT TEFLON 1X6 (MISCELLANEOUS) IMPLANT
GAUZE KITTNER 4X8 (MISCELLANEOUS) ×4 IMPLANT
GAUZE SPONGE 4X4 12PLY STRL (GAUZE/BANDAGES/DRESSINGS) ×4 IMPLANT
GLOVE SURG MICRO LTX SZ7 (GLOVE) ×4 IMPLANT
GLOVE SURG SIGNA 7.5 PF LTX (GLOVE) ×8 IMPLANT
GOWN STRL REUS W/ TWL LRG LVL3 (GOWN DISPOSABLE) ×6 IMPLANT
GOWN STRL REUS W/ TWL XL LVL3 (GOWN DISPOSABLE) ×3 IMPLANT
GOWN STRL REUS W/TWL 2XL LVL3 (GOWN DISPOSABLE) ×4 IMPLANT
GOWN STRL REUS W/TWL LRG LVL3 (GOWN DISPOSABLE) ×2
GOWN STRL REUS W/TWL XL LVL3 (GOWN DISPOSABLE) ×1
HEMOSTAT POWDER SURGIFOAM 1G (HEMOSTASIS) IMPLANT
HEMOSTAT SURGICEL 2X14 (HEMOSTASIS) IMPLANT
IRRIGATION STRYKERFLOW (MISCELLANEOUS) ×3 IMPLANT
IRRIGATOR STRYKERFLOW (MISCELLANEOUS) ×4
KIT SUCTION CATH 14FR (SUCTIONS) IMPLANT
NEEDLE 25GX 5/8IN NON SAFETY (NEEDLE) ×4 IMPLANT
NEEDLE SPNL 22GX3.5 QUINCKE BK (NEEDLE) ×4 IMPLANT
PACK CHEST (CUSTOM PROCEDURE TRAY) ×4 IMPLANT
PAD ARMBOARD 7.5X6 YLW CONV (MISCELLANEOUS) ×12 IMPLANT
PAD ELECT DEFIB RADIOL ZOLL (MISCELLANEOUS) ×4 IMPLANT
PENCIL BUTTON HOLSTER BLD 10FT (ELECTRODE) ×4 IMPLANT
RELOAD STAPLER 3.5X45 BLU DVNC (STAPLE) ×12 IMPLANT
SEAL CANN UNIV 5-8 DVNC XI (MISCELLANEOUS) ×9 IMPLANT
SEAL XI 5MM-8MM UNIVERSAL (MISCELLANEOUS) ×3
SEALER SYNCHRO 8 IS4000 DV (MISCELLANEOUS) ×1
SEALER SYNCHRO 8 IS4000 DVNC (MISCELLANEOUS) ×3 IMPLANT
SET TRI-LUMEN FLTR TB AIRSEAL (TUBING) ×4 IMPLANT
SOLUTION ELECTROLUBE (MISCELLANEOUS) ×4 IMPLANT
STAPLER CANNULA SEAL DVNC XI (STAPLE) ×3 IMPLANT
STAPLER CANNULA SEAL XI (STAPLE) ×1
STAPLER RELOAD 3.5X45 BLU DVNC (STAPLE) ×12
STAPLER RELOAD 3.5X45 BLUE (STAPLE) ×4
SUT BONE WAX W31G (SUTURE) IMPLANT
SUT SILK  1 MH (SUTURE) ×1
SUT SILK 1 MH (SUTURE) ×3 IMPLANT
SUT SILK 2 0 SH CR/8 (SUTURE) IMPLANT
SUT STEEL 6MS V (SUTURE) IMPLANT
SUT STEEL SZ 6 DBL 3X14 BALL (SUTURE) IMPLANT
SUT VIC AB 1 CTX 36 (SUTURE)
SUT VIC AB 1 CTX36XBRD ANBCTR (SUTURE) IMPLANT
SUT VIC AB 2-0 CTX 36 (SUTURE) IMPLANT
SUT VIC AB 3-0 X1 27 (SUTURE) ×8 IMPLANT
SUT VICRYL 0 UR6 27IN ABS (SUTURE) ×8 IMPLANT
SYR 20CC LL (SYRINGE) ×8 IMPLANT
SYSTEM RETRIEVAL ANCHOR 8 (MISCELLANEOUS) ×4 IMPLANT
SYSTEM SAHARA CHEST DRAIN ATS (WOUND CARE) IMPLANT
TAPE CLOTH SURG 4X10 WHT LF (GAUZE/BANDAGES/DRESSINGS) ×4 IMPLANT
TOWEL GREEN STERILE (TOWEL DISPOSABLE) IMPLANT
TOWEL GREEN STERILE FF (TOWEL DISPOSABLE) ×4 IMPLANT
TRAY FOLEY SLVR 16FR TEMP STAT (SET/KITS/TRAYS/PACK) ×4 IMPLANT
TROCAR PORT AIRSEAL 12X150 (TUBING) ×4 IMPLANT

## 2021-08-16 NOTE — Anesthesia Procedure Notes (Signed)
Arterial Line Insertion Start/End10/31/2022 7:15 AM, 08/16/2021 7:25 AM Performed by: Roderic Palau, MD  Patient location: Pre-op. Preanesthetic checklist: patient identified, IV checked, site marked, risks and benefits discussed, surgical consent, monitors and equipment checked, pre-op evaluation, timeout performed and anesthesia consent Lidocaine 1% used for infiltration Right, brachial was placed Catheter size: 20 G Hand hygiene performed , maximum sterile barriers used  and Seldinger technique used  Attempts: 1 Procedure performed using ultrasound guided technique. Ultrasound Notes:anatomy identified, needle tip was noted to be adjacent to the nerve/plexus identified, no ultrasound evidence of intravascular and/or intraneural injection and image(s) printed for medical record Following insertion, dressing applied, Biopatch and line sutured. Post procedure assessment: normal and unchanged  Post procedure complications: second provider assisted.

## 2021-08-16 NOTE — Progress Notes (Signed)
Patient temp and vital signs were checked and temp was reading 94.9 rectal. RN different methods, called and spoke to [facility]. Rod[place], PA, a bear hugger was ordered  to use with patient. Will apply and continue to monitor.

## 2021-08-16 NOTE — Transfer of Care (Signed)
Immediate Anesthesia Transfer of Care Note  Patient: Taylor Chen  Procedure(s) Performed: XI ROBOTIC ASSISTED RESECTION OF ANTERIOR MEDIASTINAL MASS (Left) LYMPH NODE BIOPSY  Patient Location: PACU  Anesthesia Type:General  Level of Consciousness: drowsy  Airway & Oxygen Therapy: Patient Spontanous Breathing  Post-op Assessment: Report given to RN and Post -op Vital signs reviewed and stable  Post vital signs: Reviewed and stable  Last Vitals:  Vitals Value Taken Time  BP    Temp    Pulse 80 08/16/21 1103  Resp 11 08/16/21 1103  SpO2 100 % 08/16/21 1103  Vitals shown include unvalidated device data.  Last Pain:  Vitals:   08/16/21 0647  TempSrc:   PainSc: 0-No pain         Complications: No notable events documented.

## 2021-08-16 NOTE — Anesthesia Procedure Notes (Signed)
Procedure Name: Intubation Date/Time: 08/16/2021 7:47 AM Performed by: Vonna Drafts, CRNA Pre-anesthesia Checklist: Patient identified, Emergency Drugs available, Suction available and Patient being monitored Patient Re-evaluated:Patient Re-evaluated prior to induction Oxygen Delivery Method: Circle system utilized Preoxygenation: Pre-oxygenation with 100% oxygen Induction Type: IV induction Ventilation: Mask ventilation without difficulty Laryngoscope Size: Mac and 3 Grade View: Grade I Tube type: Oral Endobronchial tube: Double lumen EBT and 37 Fr Number of attempts: 1 Airway Equipment and Method: Stylet and Oral airway Placement Confirmation: ETT inserted through vocal cords under direct vision, positive ETCO2 and breath sounds checked- equal and bilateral Secured at: 28 cm Tube secured with: Tape Dental Injury: Teeth and Oropharynx as per pre-operative assessment

## 2021-08-16 NOTE — Interval H&P Note (Signed)
History and Physical Interval Note:   Insurance refused PET- will proceed with Left Robotic approach with possible sternotomy if needed  08/16/2021 7:19 AM  [employer]  has presented today for surgery, with the diagnosis of ANTERIOR MEDIASTINAL MASS.  The various methods of treatment have been discussed with the patient and family. After consideration of risks, benefits and other options for treatment, the patient has consented to  Procedure(s): XI ROBOTIC ASSISTED RESECTION OF ANTERIOR MEDIASTINAL MASS, possible sternotomy (Left) possible STERNOTOMY (N/A) as a surgical intervention.  The patient's history has been reviewed, patient examined, no change in status, stable for surgery.  I have reviewed the patient's chart and labs.  Questions were answered to the patient's satisfaction.     Melrose Nakayama

## 2021-08-16 NOTE — Progress Notes (Signed)
   08/16/21 1800  Assess: MEWS Score  Temp (!) 94.9 F (34.9 C)  Level of Consciousness Alert  O2 Device Nasal Cannula  O2 Flow Rate (L/min) 2 L/min  Assess: MEWS Score  MEWS Temp 2  MEWS Systolic 0  MEWS Pulse 0  MEWS RR 0  MEWS LOC 0  MEWS Score 2  MEWS Score Color Yellow  Assess: if the MEWS score is Yellow or Red  Were vital signs taken at a resting state? Yes  Focused Assessment Change from prior assessment (see assessment flowsheet)  Early Detection of Sepsis Score *See Row Information* Low  MEWS guidelines implemented *See Row Information* Yes  Treat  MEWS Interventions Administered prn meds/treatments;Other (Comment) (spoke to PA)  Pain Scale 0-10  Pain Score 5  Pain Type Surgical pain  Pain Location Chest  Pain Intervention(s) Medication (See eMAR);Repositioned  Take Vital Signs  Increase Vital Sign Frequency  Yellow: Q 2hr X 2 then Q 4hr X 2, if remains yellow, continue Q 4hrs  Escalate  MEWS: Escalate Yellow: discuss with charge nurse/RN and consider discussing with provider and RRT  Notify: Charge Nurse/RN  Name of Charge Nurse/RN Notified Kristen, RN  Date Charge Nurse/RN Notified 08/16/21  Time Charge Nurse/RN Notified 1820  Notify: Provider  Provider Name/Title Macarthur Critchley, PA  Date Provider Notified 08/16/21  Time Provider Notified [phone]  Notification Type Call  Notification Reason Other (Comment) (temp 94.9)  Provider response Other (Comment) (apply bearhugger)  Date of Provider Response 08/16/21  Time of Provider Response [phone]

## 2021-08-16 NOTE — Brief Op Note (Signed)
08/16/2021  10:38 AM  PATIENT:  [place]  60 y.o. female  PRE-OPERATIVE DIAGNOSIS:  ANTERIOR MEDIASTINAL MASS  POST-OPERATIVE DIAGNOSIS:  ANTERIOR MEDIASTINAL MASS  PROCEDURE:  Procedure(s): XI ROBOTIC ASSISTED RESECTION OF ANTERIOR MEDIASTINAL MASS (Left) LYMPH NODE BIOPSY  SURGEON:  Melrose Nakayama, MD - Primary  PHYSICIAN ASSISTANT: Evalin Shawhan  ANESTHESIA:   general  EBL:  25 mL   BLOOD ADMINISTERED:none  DRAINS:  33fr Blake in left pleural space    LOCAL MEDICATIONS USED:  NONE  SPECIMEN:  No Specimen  DISPOSITION OF SPECIMEN:  PATHOLOGY  COUNTS:  YES  DICTATION: .Dragon Dictation  PLAN OF CARE: Admit to inpatient   PATIENT DISPOSITION:  PACU - hemodynamically stable.   Delay start of Pharmacological VTE agent (>24hrs) due to surgical blood loss or risk of bleeding: no

## 2021-08-16 NOTE — Anesthesia Procedure Notes (Signed)
Central Venous Catheter Insertion Performed by: Roderic Palau, MD, anesthesiologist Start/End10/31/2022 7:00 AM, 08/16/2021 7:15 AM Patient location: Pre-op. Preanesthetic checklist: patient identified, IV checked, site marked, risks and benefits discussed, surgical consent, monitors and equipment checked, pre-op evaluation, timeout performed and anesthesia consent Position: Trendelenburg Lidocaine 1% used for infiltration and patient sedated Hand hygiene performed , maximum sterile barriers used  and Seldinger technique used Catheter size: 8.5 Fr Total catheter length 10. Central line was placed.Sheath introducer Procedure performed using ultrasound guided technique. Ultrasound Notes:anatomy identified, needle tip was noted to be adjacent to the nerve/plexus identified, no ultrasound evidence of intravascular and/or intraneural injection and image(s) printed for medical record Attempts: 1 Following insertion, line sutured and dressing applied. Post procedure assessment: blood return through all ports, free fluid flow and no air  Patient tolerated the procedure well with no immediate complications.

## 2021-08-17 ENCOUNTER — Inpatient Hospital Stay (HOSPITAL_COMMUNITY): Payer: BC Managed Care – PPO

## 2021-08-17 ENCOUNTER — Encounter (HOSPITAL_COMMUNITY): Payer: Self-pay | Admitting: Thoracic Surgery (Cardiothoracic Vascular Surgery)

## 2021-08-17 LAB — CBC
HCT: 33.2 % — ABNORMAL LOW (ref 36.0–46.0)
Hemoglobin: 10.9 g/dL — ABNORMAL LOW (ref 12.0–15.0)
MCH: 30.2 pg (ref 26.0–34.0)
MCHC: 32.8 g/dL (ref 30.0–36.0)
MCV: 92 fL (ref 80.0–100.0)
Platelets: 313 10*3/uL (ref 150–400)
RBC: 3.61 MIL/uL — ABNORMAL LOW (ref 3.87–5.11)
RDW: 14 % (ref 11.5–15.5)
WBC: 17.5 10*3/uL — ABNORMAL HIGH (ref 4.0–10.5)
nRBC: 0 % (ref 0.0–0.2)

## 2021-08-17 LAB — BASIC METABOLIC PANEL
Anion gap: 5 (ref 5–15)
BUN: 10 mg/dL (ref 6–20)
CO2: 25 mmol/L (ref 22–32)
Calcium: 8.7 mg/dL — ABNORMAL LOW (ref 8.9–10.3)
Chloride: 104 mmol/L (ref 98–111)
Creatinine, Ser: 0.89 mg/dL (ref 0.44–1.00)
GFR, Estimated: >60 mL/min (ref >60)
Glucose, Bld: 114 mg/dL — ABNORMAL HIGH (ref 70–99)
Potassium: 4.1 mmol/L (ref 3.5–5.1)
Sodium: 134 mmol/L — ABNORMAL LOW (ref 135–145)

## 2021-08-17 MED ORDER — CHLORHEXIDINE GLUCONATE CLOTH 2 % EX PADS: 6.0000 | MEDICATED_PAD | Freq: Every day | CUTANEOUS | Status: DC

## 2021-08-17 MED ORDER — PHENOL 1.4 % MT LIQD
1.0000 | OROMUCOSAL | Status: DC | PRN
  Filled 2021-08-17: qty 177

## 2021-08-17 MED ORDER — SUMATRIPTAN SUCCINATE 100 MG PO TABS
100.0000 mg | ORAL_TABLET | ORAL | Status: DC | PRN
  Administered 2021-08-17: 100 mg via ORAL
  Filled 2021-08-17 (×2): qty 1

## 2021-08-17 NOTE — Hospital Course (Signed)
History of Present Illness:  [name] is sent for consultation regarding an anterior mediastinal mass.  Taylor Chen is a 60 year old with a past medical history significant for hypertension, heart murmur, hyperlipidemia, migraines, anxiety, thyroid nodule and a mediastinal mass.   She presented to the emergency room on 06/29/2021 with left anterior chest pain.  She described this as a burning sensation.  There was no radiation.  She was not having any shortness of breath.  Cardiac work-up was negative.  She had a CT of the chest which showed no evidence of pulmonary embolus or dissection.  She was noted to have an anterior mediastinal mass and a thyroid nodule.  She has not had any more severe episodes of the pain since then but has noted few mild episodes.  She also complains of some night sweats.   She is a lifelong non-smoker.  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  She has lost about 4 pounds over the past 3 months.  She has not had any problems with double vision or weakness.  She does complain of some difficulty swallowing and the sensation that food sticks in her throat.  Hospital Course: [name] was admitted for elective surgery 08/16/2021.  She was taken to the operating room where robotic assisted thoracoscopy was carried out for resection of an anterior mediastinal mass.  Following the procedure, she was extubated in the postanesthesia care unit.  She was then transferred to [facility] progressive care.  She had no significant air leak and minimal chest tube drainage.  Chest tube was left to waterseal.  Chest x-ray on the following morning showed good expansion of both lungs.  Again, there was no significant air leak.  Chest tube was removed on postop day 1.  The abdomen fluids and Foley catheter will discontinue.  She was mobilized.  Diet was advanced and will tolerated.

## 2021-08-17 NOTE — Progress Notes (Signed)
Mobility Specialist Progress Note    08/17/21 1630  Mobility  Activity Ambulated in hall  Level of Assistance Contact guard assist, steadying assist  Assistive Device None  Distance Ambulated (ft) 260 ft  Mobility Ambulated independently in hallway  Mobility Response Tolerated fair  Mobility performed by Mobility specialist  $Mobility charge 1 Mobility   Pt received in chair and agreeable. C/o SOB and encouraged pursed lip breathing on walk. Pt did not wear her oxygen on walk. Returned to bed with NT present.   Hildred Alamin Mobility Specialist  Mobility Specialist Phone: [phone]

## 2021-08-17 NOTE — Op Note (Signed)
Taylor Chen, Taylor Chen MEDICAL RECORD NO: [id number] ACCOUNT NO: [id number] DATE OF BIRTH: [birth year] FACILITY: [facility] LOCATION: [facility] PHYSICIAN: Revonda Standard. Roxan Hockey, MD  Operative Report   DATE OF PROCEDURE: 08/16/2021   PREOPERATIVE DIAGNOSIS:  Anterior mediastinal mass.  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  PROCEDURE:  Robotic left video-assisted thoracoscopy for resection of anterior mediastinal mass with en bloc wedge of left upper lobe and lymph node sampling.  SURGEON:  Revonda Standard. Roxan Hockey, MD  ASSISTANT:  Enid Cutter, PA  ANESTHESIA:  General.  FINDINGS:  Mass in anterior mediastinum.  No invasion of surrounding structures.  Extensive adhesions to the left upper lobe.  Therefore, en bloc wedge resection was performed, but no definite invasion. Enlarged aortopulmonary window node, which was otherwise benign in appearance.  Tumor in close proximity to the phrenic nerve, but no invasion.  CLINICAL NOTE:  Taylor Chen is a 60 year old woman who was found to have an anterior mediastinal mass and thyroid nodule after presenting with chest pain.  She had a 4.4 x 2.7 x 1.6 cm partially calcified soft tissue mass in the anterior mediastinum on the left side.  She was advised to undergo surgical resection.  The indications, risks, benefits, and alternatives were discussed in detail with the patient.  She understood and accepted the risks and agreed to proceed.    OPERATIVE NOTE: Taylor Chen was brought to the preoperative holding area on 08/16/2021.  Anesthesia placed an arterial blood pressure monitoring line and established intravenous access.  She was taken to the operating room and anesthetized and intubated  with a double lumen endotracheal tube.  Intravenous antibiotics were administered.  Sequential compression devices were placed on the calves for DVT prophylaxis.  The patient was placed with the left chest slightly elevated.  A Bair Hugger  was then placed for active warming.  The chest was prepped and  draped in the usual sterile fashion.    Timeout was performed.  Single lung ventilation of the right lung was initiated and was tolerated well throughout the procedure.  Due to the patient's body habitus, the incision for the camera port was placed an interspace lower than normal.  This was  approximately the sixth interspace laterally.  An 8 mm port was inserted.  A 12 mm port then was placed in the eighth interspace, an 8 mm port was placed in the third interspace.  All these were placed anterolaterally.  The robot was deployed and the  camera arm was docked.  The remaining arms were docked and the robotic instruments were inserted with thoracoscopic visualization.  A sponge was placed into the chest prior to docking the robot.  Inspection showed an obvious anterior mediastinal mass corresponding to the findings on CT.  There was a relatively minimal mediastinal fat pad.  Otherwise, the mass was in close proximity, but not encasing the phrenic nerve.  There were adhesions to the left upper lobe. Decision was made to take a small piece  of the left upper lobe en bloc with the tumor.  This was done with sequential firings of the robotic stapler using blue cartridges.  The dissection then was begun along the inferior border of the mass.  The pleura was incised and then working posteriorly  the phrenic nerve was dissected off carefully.  There were some relatively large vessels supplying the tumor that appeared to be branches of the internal mammary artery.  These were divided with the SynchroSeal device.  The phrenic nerve was  skeletonized along its length, but preserved.  The pleural reflection then was divided anteriorly.  The plane was developed.  The entire mediastinal fat pad was removed.  Much of the more superior dissection was done with the SynchroSeal device, dividing  the veins from the innominate vein.  The mass was adherent to the  pericardium, but did not invade the pericardium.  Once the mass had been freed up circumferentially, it was moved to the apex.  The pleura overlying the aortopulmonary window node was  incised and level 5 AP window node was removed with bipolar cautery.  Again, care was taken to preserve the phrenic nerve.  The sponge that had been placed was removed.  The robotic instruments were undocked.  An 8 mm endoscopic retrieval bag was placed  through the eighth interspace incision and the mass and lymph node were manipulated into the bag and removed.  Inspection was made for hemostasis.  There was no significant bleeding.  A 28-French Blake drain was placed through the 6th interspace incision  and secured with a #1 silk suture.  Dual lung ventilation was resumed.  The remaining incisions were closed in standard fashion.  A chest tube was placed to a Pleur-Evac on waterseal.  Incisions were closed in standard fashion.  Chest tube was placed to  a Pleur-Evac on waterseal.  The patient was extubated in the operating room and taken to the postanesthetic care unit in good condition.   Taylor Chen served as the assistant for this case, providing retraction and exposure, instrument exchange, suturing and other services as needed.   SUJ D: 08/16/2021 6:28:03 pm T: 08/17/2021 1:26:00 am  JOB: [id number]/ [id number]

## 2021-08-17 NOTE — Plan of Care (Signed)

## 2021-08-17 NOTE — Progress Notes (Addendum)
      [address]       [place] [address]             [phone]      1 Day Post-Op Procedure(s) (LRB): XI ROBOTIC ASSISTED RESECTION OF ANTERIOR MEDIASTINAL MASS (Left) LYMPH NODE BIOPSY Subjective: Awake and reasonably comfortable. Having some mid chest soreness with activity. Also feels she was getting a migraine and asked for her Imitrex to be resumed.  O2 at 2L/[place]. Tolerating PO's  Objective: Vital signs in last 24 hours: Temp:  [94.6 F (34.8 C)-98.1 F (36.7 C)] 98.1 F (36.7 C) (11/01 0420) Pulse Rate:  [72-110] 98 (11/01 0420) Cardiac Rhythm: Heart block (10/31 1903) Resp:  [11-18] 12 (11/01 0420) BP: (97-132)/(60-85) 132/81 (11/01 0420) SpO2:  [90 %-100 %] 96 % (11/01 0420) Arterial Line BP: (116-127)/(62-70) 116/62 (10/31 1150)  Hemodynamic parameters for last 24 hours:    Intake/Output from previous day: 10/31 0701 - 11/01 0700 In: 3374.1 [P.O.:230; I.V.:2944.1; IV Piggyback:200] Out: 9147 [Urine:3605; Blood:25; Chest Tube:66] Intake/Output this shift: Total I/O In: 50 [IV Piggyback:50] Out: -   General appearance: alert, cooperative, and mild distress Neurologic: intact Heart: regular rate and rhythm Lungs: breath sounds clear to auscultation. Minimal CT drainage. No air leak. CXR showing good expansion of both lungs.  Wound: Chest incisions are dry.   Lab Results: Recent Labs    08/17/21 0535  WBC 17.5*  HGB 10.9*  HCT 33.2*  PLT 313   BMET:  Recent Labs    08/17/21 0535  NA 134*  K 4.1  CL 104  CO2 25  GLUCOSE 114*  BUN 10  CREATININE 0.89  CALCIUM 8.7*    PT/INR: No results for input(s): LABPROT, INR in the last 72 hours. ABG    Component Value Date/Time   PHART 7.400 08/12/2021 1150   HCO3 24.1 08/12/2021 1150   ACIDBASEDEF 0.1 08/12/2021 1150   O2SAT 98.0 08/12/2021 1150   CBG (last 3)  No results for input(s): GLUCAP in the last 72 hours.  Assessment/Plan: S/P Procedure(s) (LRB): XI ROBOTIC  ASSISTED RESECTION OF ANTERIOR MEDIASTINAL MASS (Left) LYMPH NODE BIOPSY  -POD-1 robotic-assisted resection of suspected thymoma. Pain controlled and resp status stable.  Mobilize, advance diet as tolerated, remove the Foley catheter and TLC.  CT is on water seal and likely may be removed.   -H/O migraine- resume Imetrex prn  -H/O HTN-BP stable, resuming amlodipine today.   -Mild expected ABL anemia- minitor  -DVT PPX- on  lovenox.    LOS: 1 day    Antony Odea, [place] [phone] 08/17/2021 Patient seen and examined. Looks great Dc chest tube- no air leak and minimal drainage Ambulate  Remo Lipps C. Roxan Hockey, MD [facility] [phone]

## 2021-08-17 NOTE — Discharge Summary (Addendum)
Physician Discharge Summary  Patient ID: Taylor Chen MRN: [id number] DOB/AGE: 59-Oct-1962 59 y.o.  Admit date: 08/16/2021 Discharge date: 08/18/2021  Admission Diagnoses:  Anterior mediastinal mass History of migraine Dyslipidemia Hypertension  Discharge Diagnoses:   Thymoma, type A History of migraine Dyslipidemia Hypertension  Discharged Condition: stable  History of Present Illness:  Taylor Chen is sent for consultation regarding an anterior mediastinal mass.  Taylor Chen is a 60 year old with a past medical history significant for hypertension, heart murmur, hyperlipidemia, migraines, anxiety, thyroid nodule and a mediastinal mass.   She presented to the emergency room on 06/29/2021 with left anterior chest pain.  She described this as a burning sensation.  There was no radiation.  She was not having any shortness of breath.  Cardiac work-up was negative.  She had a CT of the chest which showed no evidence of pulmonary embolus or dissection.  She was noted to have an anterior mediastinal mass and a thyroid nodule.  She has not had any more severe episodes of the pain since then but has noted few mild episodes.  She also complains of some night sweats.   She is a lifelong non-smoker.  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  She has lost about 4 pounds over the past 3 months.  She has not had any problems with double vision or weakness.  She does complain of some difficulty swallowing and the sensation that food sticks in her throat.  Hospital Course: Taylor Chen was admitted for elective surgery 08/16/2021.  She was taken to the operating room where robotic assisted thoracoscopy was carried out for resection of an anterior mediastinal mass.  Following the procedure, she was extubated in the postanesthesia care unit.  She was then transferred to [facility] progressive care.  She had no significant air leak and minimal chest tube drainage.  Chest tube was left to  waterseal.  Chest x-ray on the following morning showed good expansion of both lungs.  Again, there was no significant air leak.  Chest tube was removed on postop day 1.  The abdomen fluids and Foley catheter will discontinue.  She was mobilized.  Diet was advanced and well tolerated. Follow up CXR on the day of discharge showed no PTX and stable elevation of the left hemidiaphragm. This was not unexpected since the mass was in close proximity to the left phrenic nerve.  She was ready for discharge on post-op day 2.  Consults: None  Significant Diagnostic Studies:   EXAM: CHEST - 1 VIEW SAME DAY   COMPARISON:  Previous studies including the examination done earlier today   FINDINGS: There is interval removal of left chest tube. There is no pneumothorax. There small linear densities in left lower lung fields suggesting subsegmental atelectasis. No new focal infiltrates are seen. There are no signs of pulmonary edema. There is minimal blunting of left lateral CP angle. There is interval removal of large caliber right jugular central venous catheter. Left hemidiaphragm is elevated. There is gaseous distention of stomach.   IMPRESSION: Status post removal of left chest tube and right central venous catheter. There is no pneumothorax. Subsegmental atelectasis is seen in the left lower lung fields. There is elevation of left hemidiaphragm due to gaseous distention of stomach.     Electronically Signed   By: Elmer Picker M.D.   On: 08/17/2021 13:12  Treatments:   Operative Report    DATE OF PROCEDURE: 08/16/2021     PREOPERATIVE DIAGNOSIS:  Anterior mediastinal  mass.   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   PROCEDURE:  Robotic left video-assisted thoracoscopy for resection of anterior mediastinal mass, en bloc wedge of left upper lobe and lymph node sampling.   SURGEON:  Revonda Standard. Roxan Hockey, MD   ASSISTANT:  Enid Cutter, PA    ANESTHESIA:  General.   FINDINGS:  Mass in anterior mediastinum.  No invasion of surrounding structures, extensive adhesions to the left upper lobe.  Therefore, en bloc wedge resection was performed, but no definite invasion and enlarged aortopulmonary window node.  Otherwise,  benign in appearance.  Tumor in close proximity to the phrenic nerve, but no invasion.  Discharge Exam: Blood pressure 121/75, pulse (!) 104, temperature 97.9 F (36.6 C), temperature source Oral, resp. rate 20, height 5\' 1"  (1.549 m), weight 82.1 kg, SpO2 95 %.  General appearance: alert, cooperative, and no distress Neurologic: intact Heart: regular rate and rhythm Lungs: breath sounds clear to auscultation. CXR stable, no PTX, mild elevation of left hemidiaphragm.  Wound: Chest incisions are dry.   Disposition: Discharged to home in stable condition.   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 Reaction: yeast infection   Percocet [oxycodone-acetaminophen] Nausea And Vomiting        Medication List     TAKE these medications    amLODipine 5 MG tablet Commonly known as: NORVASC Take 7.5 mg by mouth daily.   aspirin EC 81 MG tablet Take 81 mg by mouth daily. Swallow whole.   atorvastatin 20 MG tablet Commonly known as: LIPITOR Take 20 mg by mouth at  bedtime.   CALCIUM PO Take 1 tablet by mouth daily.   cetirizine 10 MG tablet Commonly known as: ZYRTEC Take 10 mg by mouth daily as needed for allergies.   doxepin 75 MG capsule Commonly known as: SINEQUAN Take 150 mg by mouth at bedtime.   latanoprost 0.005 % ophthalmic solution Commonly known as: XALATAN Place 1 drop into both eyes at bedtime.   ONE-A-DAY 50 PLUS PO Take 1 tablet by mouth daily.   SUMAtriptan 100 MG tablet Commonly known as: IMITREX Take 100 mg by mouth every 2 (two) hours as needed for migraine. May repeat in 2 hours if headache persists or recurs.   traMADol 50 MG tablet Commonly known as: Ultram Take 1 tablet (50 mg total) by mouth every 6 (six) hours as needed for up to 5 days.        Follow-up Information     Melrose Nakayama, MD. Go on 09/14/2021.   Specialty: Cardiothoracic Surgery Why: Your appointment is at 10:30am.  Please arrive 30 minutes early for a chest x-ray to be performed by [facility] located on the first floor of the same building. Contact information: [address] [place] [address] [phone]  Signed: Antony Odea, PA-C 08/18/2021, 7:49 AM

## 2021-08-18 ENCOUNTER — Other Ambulatory Visit: Payer: Self-pay | Admitting: Physician Assistant

## 2021-08-18 ENCOUNTER — Inpatient Hospital Stay (HOSPITAL_COMMUNITY): Payer: BC Managed Care – PPO

## 2021-08-18 LAB — COMPREHENSIVE METABOLIC PANEL
ALT: 28 U/L (ref 0–44)
AST: 30 U/L (ref 15–41)
Albumin: 3.5 g/dL (ref 3.5–5.0)
Alkaline Phosphatase: 66 U/L (ref 38–126)
Anion gap: 7 (ref 5–15)
BUN: 12 mg/dL (ref 6–20)
CO2: 26 mmol/L (ref 22–32)
Calcium: 8.8 mg/dL — ABNORMAL LOW (ref 8.9–10.3)
Chloride: 105 mmol/L (ref 98–111)
Creatinine, Ser: 1 mg/dL (ref 0.44–1.00)
GFR, Estimated: >60 mL/min (ref >60)
Glucose, Bld: 110 mg/dL — ABNORMAL HIGH (ref 70–99)
Potassium: 4 mmol/L (ref 3.5–5.1)
Sodium: 138 mmol/L (ref 135–145)
Total Bilirubin: 0.5 mg/dL (ref 0.3–1.2)
Total Protein: 6.7 g/dL (ref 6.5–8.1)

## 2021-08-18 LAB — CBC
HCT: 36.3 % (ref 36.0–46.0)
Hemoglobin: 11.6 g/dL — ABNORMAL LOW (ref 12.0–15.0)
MCH: 30 pg (ref 26.0–34.0)
MCHC: 32 g/dL (ref 30.0–36.0)
MCV: 93.8 fL (ref 80.0–100.0)
Platelets: 350 10*3/uL (ref 150–400)
RBC: 3.87 MIL/uL (ref 3.87–5.11)
RDW: 14.6 % (ref 11.5–15.5)
WBC: 15.8 10*3/uL — ABNORMAL HIGH (ref 4.0–10.5)
nRBC: 0 % (ref 0.0–0.2)

## 2021-08-18 LAB — SURGICAL PATHOLOGY

## 2021-08-18 MED ORDER — HYDROCODONE-ACETAMINOPHEN 5-325 MG PO TABS: 1.0000 | ORAL_TABLET | ORAL | 0 refills | Status: AC | PRN

## 2021-08-18 MED ORDER — TRAMADOL HCL 50 MG PO TABS: 50.0000 mg | ORAL_TABLET | Freq: Four times a day (QID) | ORAL | 0 refills | Status: AC | PRN

## 2021-08-18 NOTE — Discharge Instructions (Signed)

## 2021-08-18 NOTE — Progress Notes (Signed)
[facility] pharmacy contacted hospital unit in regards to patient's discharge pain medication.  She was prescribed Tramadol which interacts highly with Doxepin.  Due to this I instructed the pharmacy to not fill the prescription.  I spoke with the patient and she has tolerated Vicodin without N/V.  A new RX was sent to the pharmacy.  Ellwood Handler, PA-C

## 2021-08-18 NOTE — Progress Notes (Signed)
      [address]       ,[place] [address]             [phone]      2 Days Post-Op Procedure(s) (LRB): XI ROBOTIC ASSISTED RESECTION OF ANTERIOR MEDIASTINAL MASS (Left) LYMPH NODE BIOPSY Subjective: Awake and alert. Having less soreness today. No shortness of breath since removal of the chest tube yesterday.  Tolerating PO's  Objective: Vital signs in last 24 hours: Temp:  [97.9 F (36.6 C)-98.5 F (36.9 C)] 97.9 F (36.6 C) (11/02 0416) Pulse Rate:  [96-117] 104 (11/02 0416) Cardiac Rhythm: Sinus tachycardia (11/02 0726) Resp:  [15-23] 20 (11/02 0416) BP: (119-131)/(75-93) 121/75 (11/02 0416) SpO2:  [93 %-96 %] 95 % (11/02 0416)     Intake/Output from previous day: 11/01 0701 - 11/02 0700 In: 530 [P.O.:480; IV Piggyback:50] Out: 310 [Urine:300; Chest Tube:10] Intake/Output this shift: No intake/output data recorded.  General appearance: alert, cooperative, and no distress Neurologic: intact Heart: regular rate and rhythm Lungs: breath sounds clear to auscultation. CXR stable, no PTX, mild elevation of left hemidiaphragm.  Wound: Chest incisions are dry.   Lab Results: Recent Labs    08/17/21 0535 08/18/21 0108  WBC 17.5* 15.8*  HGB 10.9* 11.6*  HCT 33.2* 36.3  PLT 313 350    BMET:  Recent Labs    08/17/21 0535 08/18/21 0108  NA 134* 138  K 4.1 4.0  CL 104 105  CO2 25 26  GLUCOSE 114* 110*  BUN 10 12  CREATININE 0.89 1.00  CALCIUM 8.7* 8.8*     PT/INR: No results for input(s): LABPROT, INR in the last 72 hours. ABG    Component Value Date/Time   PHART 7.400 08/12/2021 1150   HCO3 24.1 08/12/2021 1150   ACIDBASEDEF 0.1 08/12/2021 1150   O2SAT 98.0 08/12/2021 1150   CBG (last 3)  No results for input(s): GLUCAP in the last 72 hours.  Assessment/Plan: S/P Procedure(s) (LRB): XI ROBOTIC ASSISTED RESECTION OF ANTERIOR MEDIASTINAL MASS (Left) LYMPH NODE BIOPSY  -POD-2 robotic-assisted resection of anterior chest  mass, suspected thymoma. Path is pending.  Pain controlled and resp status stable post chest tube removal.   -H/O HTN-BP stable, on her usual amlodipine   -Mild expected ABL anemia- Hgb 11, improving.   -DVT PPX- on [place] lovenox.   -Disposition- discharge to home today. Chest tube site suture removal in 1 week and f/u with Dr. Roxan Hockey on 11/29.   LOS: 2 days    Antony Odea, PA-C [phone] 08/18/2021

## 2021-08-18 NOTE — Anesthesia Postprocedure Evaluation (Signed)
Anesthesia Post Note  Patient: Taylor Chen  Procedure(s) Performed: XI ROBOTIC ASSISTED RESECTION OF ANTERIOR MEDIASTINAL MASS (Left) LYMPH NODE BIOPSY     Anesthesia Post Evaluation No notable events documented.  Last Vitals:  Vitals:   08/18/21 0416 08/18/21 0821  BP: 121/75 134/85  Pulse: (!) 104 (!) 107  Resp: 20 16  Temp: 36.6 C 36.4 C  SpO2: 95%     Last Pain:  Vitals:   08/18/21 0821  TempSrc: Oral  PainSc:                  Nolon Nations

## 2021-08-19 LAB — BPAM RBC
Blood Product Expiration Date: 202211192359
Blood Product Expiration Date: 202211212359
Blood Product Expiration Date: 202211232359
Blood Product Expiration Date: 202211242359
ISSUE DATE / TIME: 202210251114
ISSUE DATE / TIME: 202210270811
ISSUE DATE / TIME: 202210310638
ISSUE DATE / TIME: 202210310638
Unit Type and Rh: 5100
Unit Type and Rh: 5100
Unit Type and Rh: 5100
Unit Type and Rh: 5100

## 2021-08-19 LAB — TYPE AND SCREEN
ABO/RH(D): O POS
Antibody Screen: NEGATIVE
Unit division: 0
Unit division: 0
Unit division: 0
Unit division: 0

## 2021-08-23 ENCOUNTER — Telehealth: Payer: Self-pay | Admitting: *Deleted

## 2021-08-23 DIAGNOSIS — F411 Generalized anxiety disorder: Secondary | ICD-10-CM | POA: Diagnosis not present

## 2021-08-23 NOTE — Telephone Encounter (Signed)
Contacted Taylor Chen to follow-up on call placed to answering service over the weekend. Per patient, her question was answered by on call physician. Reviewed upcoming appts with patient. No further concerns at this time.

## 2021-08-25 ENCOUNTER — Encounter (INDEPENDENT_AMBULATORY_CARE_PROVIDER_SITE_OTHER): Payer: Self-pay

## 2021-08-25 ENCOUNTER — Other Ambulatory Visit: Payer: Self-pay

## 2021-08-25 DIAGNOSIS — Z4802 Encounter for removal of sutures: Secondary | ICD-10-CM

## 2021-08-27 DIAGNOSIS — F411 Generalized anxiety disorder: Secondary | ICD-10-CM | POA: Diagnosis not present

## 2021-08-30 DIAGNOSIS — F411 Generalized anxiety disorder: Secondary | ICD-10-CM | POA: Diagnosis not present

## 2021-08-31 ENCOUNTER — Ambulatory Visit (INDEPENDENT_AMBULATORY_CARE_PROVIDER_SITE_OTHER): Payer: BC Managed Care – PPO | Admitting: Cardiology

## 2021-08-31 ENCOUNTER — Encounter: Payer: Self-pay | Admitting: Cardiology

## 2021-08-31 ENCOUNTER — Other Ambulatory Visit: Payer: Self-pay

## 2021-08-31 VITALS — BP 118/72 | HR 96 | Ht 61.0 in | Wt 173.0 lb

## 2021-08-31 DIAGNOSIS — R079 Chest pain, unspecified: Secondary | ICD-10-CM

## 2021-08-31 DIAGNOSIS — E7800 Pure hypercholesterolemia, unspecified: Secondary | ICD-10-CM | POA: Diagnosis not present

## 2021-08-31 DIAGNOSIS — J9859 Other diseases of mediastinum, not elsewhere classified: Secondary | ICD-10-CM | POA: Diagnosis not present

## 2021-08-31 DIAGNOSIS — I10 Essential (primary) hypertension: Secondary | ICD-10-CM | POA: Diagnosis not present

## 2021-08-31 DIAGNOSIS — D4989 Neoplasm of unspecified behavior of other specified sites: Secondary | ICD-10-CM | POA: Diagnosis not present

## 2021-08-31 NOTE — Progress Notes (Addendum)
Cardiology CONSULT Note    Date:  08/31/2021   ID:  Taylor Chen, DOB 04/15/1961, MRN [id number]  PCP:  [name], MD  Cardiologist:  Fransico Him, MD   Chief Complaint  Patient presents with   New Patient (Initial Visit)    Chest pain     History of Present Illness:  Taylor Chen is a 60 y.o. female who is being seen today for the evaluation of Chest pain at the request of [name], MD.  This is a 60yo AAF with a hx of anemia, asthma, depression, migraine HAs, heart murmur in the past, HTN and DM.  She was referred back in Sept for chest pain workup when she presented to the ER with complaints of anterior chest pain that was a burning sensation and was dx with an anterior mediastinal mass and thyroid nodule.  She subsequently underwent thoracoscopy and resection with dx of thymoma.  She had an uneventful hospital course.  She is here today as the ER referred her after her initial workup for CP prior to being dx with her thymoma.  She described it as a burning sensation that occurred while driving one morning.  It was midsternal with no radiation to her arms or neck.  She denied any associated nausea, diaphoresis or SOB with the pain but has had chronic sweating during the day that has improved.    She denies any chest pain or pressure, SOB, DOE, PND, orthopnea, LE edema, dizziness, palpitations or syncope. She still occasionally feels some sweatiness. She is compliant with her meds and is tolerating meds with no SE.     Past Medical History:  Diagnosis Date   Anemia    IN HER 20'S   Anginal pain ([place])    LAST COUPLE DAYS   Anxiety    Asthma    CHILDHOOD   Depression    Headache    MIGRAINES   Heart murmur    HAS BEEN TOLD   History of stress test 2012   Hypertension    CONTROLLED BY DIET   Pre-diabetes     Past Surgical History:  Procedure Laterality Date   DILATATION & CURETTAGE/HYSTEROSCOPY WITH MYOSURE N/A 08/25/2016   Procedure: DILATATION &  CURETTAGE/HYSTEROSCOPY WITH MYOSURE;  Surgeon: Linda Hedges, DO;  Location: [facility];  Service: Gynecology;  Laterality: N/A;   HYSTOSCOPY  2010   INCISION AND DRAINAGE  1999   LYMPH NODE BIOPSY  08/16/2021   Procedure: LYMPH NODE BIOPSY;  Surgeon: Melrose Nakayama, MD;  Location: [facility];  Service: Thoracic;;    Current Medications: Current Meds  Medication Sig   amLODipine (NORVASC) 5 MG tablet Take 7.5 mg by mouth daily.   aspirin EC 81 MG tablet Take 81 mg by mouth daily. Swallow whole.   atorvastatin (LIPITOR) 20 MG tablet Take 20 mg by mouth at bedtime.   CALCIUM PO Take 1 tablet by mouth daily.   cetirizine (ZYRTEC) 10 MG tablet Take 10 mg by mouth daily as needed for allergies.   doxepin (SINEQUAN) 75 MG capsule Take 150 mg by mouth at bedtime.   HYDROcodone-acetaminophen (NORCO/VICODIN) 5-325 MG tablet Take 1 tablet by mouth every 4 (four) hours as needed for moderate pain.   latanoprost (XALATAN) 0.005 % ophthalmic solution Place 1 drop into both eyes at bedtime.   Multiple Vitamins-Minerals (ONE-A-DAY 50 PLUS PO) Take 1 tablet by mouth daily.   SUMAtriptan (IMITREX) 100 MG tablet Take 100 mg by mouth every 2 (two)  hours as needed for migraine. May repeat in 2 hours if headache persists or recurs.    Allergies:   Hydrochlorothiazide, Lisinopril, Metoprolol, Penicillins, and Percocet [oxycodone-acetaminophen]   Social History   Socioeconomic History   Marital status: Single    Spouse name: Not on file   Number of children: Not on file   Years of education: Not on file   Highest education level: Not on file  Occupational History   Not on file  Tobacco Use   Smoking status: Never   Smokeless tobacco: Never  Vaping Use   Vaping Use: Never used  Substance and Sexual Activity   Alcohol use: No   Drug use: No   Sexual activity: Not Currently  Other Topics Concern   Not on file  Social History Narrative   Not on file   Social Determinants of Health   Financial  Resource Strain: Not on file  Food Insecurity: Not on file  Transportation Needs: Not on file  Physical Activity: Not on file  Stress: Not on file  Social Connections: Not on file     Family History:  The patient's family history includes Breast cancer (age of onset: 31) in her mother.   ROS:   Please see the history of present illness.    ROS All other systems reviewed and are negative.  No flowsheet data found.   PHYSICAL EXAM:   VS:  BP 118/72   Pulse 96   Ht 5\' 1"  (1.549 m)   Wt 173 lb (78.5 kg)   SpO2 97%   BMI 32.69 kg/m    GEN: Well nourished, well developed, in no acute distress  HEENT: normal  Neck: no JVD, carotid bruits, or masses Cardiac: RRR; no murmurs, rubs, or gallops,no edema.  Intact distal pulses bilaterally.  Respiratory:  clear to auscultation bilaterally, normal work of breathing GI: soft, nontender, nondistended, + BS MS: no deformity or atrophy  Skin: warm and dry, no rash Neuro:  Alert and Oriented x 3, Strength and sensation are intact Psych: euthymic mood, full affect  Wt Readings from Last 3 Encounters:  08/31/21 173 lb (78.5 kg)  08/16/21 181 lb (82.1 kg)  08/12/21 181 lb 14.4 oz (82.5 kg)      Studies/Labs Reviewed:   EKG:  EKG is not ordered today.   Recent Labs: 07/23/2021: TSH 2.241 08/18/2021: ALT 28; BUN 12; Creatinine, Ser 1.00; Hemoglobin 11.6; Platelets 350; Potassium 4.0; Sodium 138   Lipid Panel No results found for: CHOL, TRIG, HDL, CHOLHDL, VLDL, LDLCALC, LDLDIRECT   Additional studies/ records that were reviewed today include:  Hospital notes    ASSESSMENT:    1. Chest pain of uncertain etiology      PLAN:  In order of problems listed above:  Chest pain -atypical in presentation and was later dx with thymoma which was resected and CP has resolved>>it was a burning sensation in her chest that I suspect was related to her thymoma and has since resolved with resection -EKG is nonischemic -she has never  smoked, no fm hx of CAD and recent Chest CT with no coronary artery calcifications -I will get a coronary Ca score to assess future risk   Time Spent: 20 minutes total time of encounter, including 15 minutes spent in face-to-face patient care on the date of this encounter. This time includes coordination of care and counseling regarding above mentioned problem list. Remainder of non-face-to-face time involved reviewing chart documents/testing relevant to the patient encounter and documentation in  the medical record. I have independently reviewed documentation from referring provider  Medication Adjustments/Labs and Tests Ordered: Current medicines are reviewed at length with the patient today.  Concerns regarding medicines are outlined above.  Medication changes, Labs and Tests ordered today are listed in the Patient Instructions below.  There are no Patient Instructions on file for this visit.   Signed, Fransico Him, MD  08/31/2021 2:22 PM    [facility] [place], [place], [place]  [address] Phone: [phone]; Fax: [phone]

## 2021-08-31 NOTE — Addendum Note (Signed)
Addended by: Antonieta Iba on: 08/31/2021 02:35 PM   Modules accepted: Orders

## 2021-08-31 NOTE — Patient Instructions (Signed)
Medication Instructions:  Your physician recommends that you continue on your current medications as directed. Please refer to the Current Medication list given to you today.  *If you need a refill on your cardiac medications before your next appointment, please call your pharmacy*  Testing/Procedures: Your provider has recommended that you have a calcium score CT scan.   Follow-Up: At [facility], you and your health needs are our priority.  As part of our continuing mission to provide you with exceptional heart care, we have created designated Provider Care Teams.  These Care Teams include your primary Cardiologist (physician) and Advanced Practice Providers (APPs -  Physician Assistants and Nurse Practitioners) who all work together to provide you with the care you need, when you need it.  Follow up with Dr. Radford Pax as needed based on results of testing. 1}

## 2021-09-06 DIAGNOSIS — F411 Generalized anxiety disorder: Secondary | ICD-10-CM | POA: Diagnosis not present

## 2021-09-10 ENCOUNTER — Other Ambulatory Visit: Payer: Self-pay | Admitting: Physician Assistant

## 2021-09-10 DIAGNOSIS — D4989 Neoplasm of unspecified behavior of other specified sites: Secondary | ICD-10-CM

## 2021-09-13 ENCOUNTER — Other Ambulatory Visit: Payer: Self-pay | Admitting: Thoracic Surgery (Cardiothoracic Vascular Surgery)

## 2021-09-13 DIAGNOSIS — J9859 Other diseases of mediastinum, not elsewhere classified: Secondary | ICD-10-CM

## 2021-09-13 DIAGNOSIS — F411 Generalized anxiety disorder: Secondary | ICD-10-CM | POA: Diagnosis not present

## 2021-09-13 NOTE — Addendum Note (Signed)
Addended by: Antonieta Iba on: 09/13/2021 08:10 AM   Modules accepted: Orders

## 2021-09-14 ENCOUNTER — Telehealth: Payer: Self-pay | Admitting: *Deleted

## 2021-09-14 ENCOUNTER — Ambulatory Visit (INDEPENDENT_AMBULATORY_CARE_PROVIDER_SITE_OTHER): Payer: Self-pay | Admitting: Thoracic Surgery (Cardiothoracic Vascular Surgery)

## 2021-09-14 ENCOUNTER — Ambulatory Visit
Admission: RE | Admit: 2021-09-14 | Discharge: 2021-09-14 | Disposition: A | Discharge status: Home / Self Care | Payer: BC Managed Care – PPO | Source: Ambulatory Visit | Attending: Thoracic Surgery (Cardiothoracic Vascular Surgery) | Admitting: Thoracic Surgery (Cardiothoracic Vascular Surgery)

## 2021-09-14 ENCOUNTER — Encounter: Payer: Self-pay | Admitting: Thoracic Surgery (Cardiothoracic Vascular Surgery)

## 2021-09-14 ENCOUNTER — Other Ambulatory Visit: Payer: Self-pay

## 2021-09-14 VITALS — BP 112/80 | HR 115 | Resp 20 | Ht 61.0 in | Wt 166.0 lb

## 2021-09-14 DIAGNOSIS — C37 Malignant neoplasm of thymus: Secondary | ICD-10-CM

## 2021-09-14 DIAGNOSIS — R0602 Shortness of breath: Secondary | ICD-10-CM | POA: Diagnosis not present

## 2021-09-14 DIAGNOSIS — Z09 Encounter for follow-up examination after completed treatment for conditions other than malignant neoplasm: Secondary | ICD-10-CM

## 2021-09-14 DIAGNOSIS — J9859 Other diseases of mediastinum, not elsewhere classified: Secondary | ICD-10-CM

## 2021-09-14 NOTE — Progress Notes (Signed)
[address]       [place] [address]             [phone]     HPI: 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  She presented to the ED in September with left anterior chest pain.  Work-up was negative for cardiac issue.  CT of the chest showed an anterior mediastinal mass.  I did a left robotic thymectomy on 08/16/2021.  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  There was no invasion of surrounding structures.  It was a very close proximity to the left phrenic nerve.  She did well postoperatively and went home on day 2.  Currently she feels well.  She has some occasional discomfort but is not requiring any pain medication.  She also says that her back pain and mobility have improved since the surgery.  No shortness of breath.  Past Medical History:  Diagnosis Date   Anemia    IN HER 20'S   Anginal pain (HCC)    LAST COUPLE DAYS   Anxiety    Asthma    CHILDHOOD   Depression    Headache    MIGRAINES   Heart murmur    HAS BEEN TOLD   History of stress test 2012   Hypertension    CONTROLLED BY DIET   Pre-diabetes       Current Outpatient Medications  Medication Sig Dispense Refill   amLODipine (NORVASC) 5 MG tablet Take 7.5 mg by mouth daily.     aspirin EC 81 MG tablet Take 81 mg by mouth daily. Swallow whole.     atorvastatin (LIPITOR) 20 MG tablet Take 20 mg by mouth at bedtime.     CALCIUM PO Take 1 tablet by mouth daily.     cetirizine (ZYRTEC) 10 MG tablet Take 10 mg by mouth daily as needed for allergies.     doxepin (SINEQUAN) 75 MG capsule Take 150 mg by mouth at bedtime.     HYDROcodone-acetaminophen (NORCO/VICODIN) 5-325 MG tablet Take 1 tablet by mouth every 4 (four) hours as needed for moderate pain. 30 tablet  0   latanoprost (XALATAN) 0.005 % ophthalmic solution Place 1 drop into both eyes at bedtime.     Multiple Vitamins-Minerals (ONE-A-DAY 50 PLUS PO) Take 1 tablet by mouth daily.     SUMAtriptan (IMITREX) 100 MG tablet Take 100 mg by mouth every 2 (two) hours as needed for migraine. May repeat in 2 hours if headache persists or recurs.     No current facility-administered medications for this visit.    Physical Exam BP 112/80 (BP Location: Left Arm, Patient Position: Sitting)   Pulse (!) 115   Resp 20   Ht 5\' 1"  (1.549 m)   Wt 166 lb (75.3 kg)   SpO2 95% Comment: RA  BMI 31.23 kg/m  60 year old woman in no acute distress Alert and oriented x3 with no focal deficits Cardiac tachycardic, regular Lungs slightly diminished at left base Incisions well-healed  Diagnostic Tests: I personally reviewed her chest x-ray.  Shows slight elevation of the left hemidiaphragm.  Impression: Dazia Speckman is a 60 year old woman who presented with chest pain.  Work-up was negative for cardiac source but she was found to have an anterior mediastinal mass.  I did a robotic left VATS for resection of the mass on 08/16/2021.  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  It was rather large 6.9 cm.  There was no invasion of surrounding structures.  In the absence of invasion of surrounding structures and with type a morphology this is likely benign.  However given its size there is always a chance of recurrence.  I am going to refer her to Dr. Julien Nordmann at our multidisciplinary thoracic oncology clinic for long-term follow-up.  From a surgical standpoint she is doing extremely well.  There are no active restrictions on her activities.  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  There is some mild elevation of left hemidiaphragm.  I will another x-ray in 6 months.  The phrenic nerve was well visualized and preserved during the surgery but given the proximity of the tumor she likely has some  mild dysfunction of the nerve.  Plan:  Refer to Dr. Julien Nordmann for long-term follow-up Return in 6 months with PA and lateral chest x-ray to reevaluate left hemidiaphragm  Melrose Nakayama, MD [facility] [phone]

## 2021-09-14 NOTE — Telephone Encounter (Signed)
I received referral on Taylor Chen today. I called to schedule with med onc but was unable to reach. I did leave vm message with my name and phone number to call.

## 2021-09-15 ENCOUNTER — Encounter: Payer: Self-pay | Admitting: *Deleted

## 2021-09-15 NOTE — Progress Notes (Signed)
Oncology Nurse Navigator Documentation  Oncology Nurse Navigator Flowsheets 09/15/2021 07/20/2021  Abnormal Finding Date - 06/29/2021  Navigator Location [facility] -  Navigator Encounter Type Telephone/I received referral on Ms. Vandenbos.  Per Dr. Julien Nordmann, I called to schedule her but was unable to reach. I did leave a vm message with my name and phone number to call.  -  Telephone Outgoing Call -  Barriers/Navigation Needs Education -  Education Other -  Interventions Other -  Acuity Level 2-Minimal Needs (1-2 Barriers Identified) -  Time Spent with Patient 30 -

## 2021-09-21 ENCOUNTER — Telehealth: Payer: Self-pay | Admitting: Physician Assistant

## 2021-09-21 NOTE — Telephone Encounter (Signed)
Scheduled per 11/25 scheduled message, patient has been called and notified.

## 2021-09-27 ENCOUNTER — Inpatient Hospital Stay: Admission: RE | Admit: 2021-09-27 | Payer: BC Managed Care – PPO | Source: Ambulatory Visit

## 2021-09-27 ENCOUNTER — Other Ambulatory Visit: Payer: Self-pay

## 2021-09-27 ENCOUNTER — Ambulatory Visit: Admission: RE | Admit: 2021-09-27 | Payer: Self-pay | Source: Ambulatory Visit

## 2021-09-28 ENCOUNTER — Telehealth: Payer: Self-pay | Admitting: Internal Medicine

## 2021-09-28 NOTE — Telephone Encounter (Signed)
Called pt to sch new pt appt with Dr. Julien Nordmann, per 12/13 staff msg from [place]. No answer. Left msg for pt to call back to sch appt.

## 2021-10-15 DIAGNOSIS — E041 Nontoxic single thyroid nodule: Secondary | ICD-10-CM | POA: Diagnosis not present

## 2021-10-21 ENCOUNTER — Other Ambulatory Visit: Payer: Self-pay | Admitting: Surgery

## 2021-10-21 DIAGNOSIS — E041 Nontoxic single thyroid nodule: Secondary | ICD-10-CM

## 2021-10-26 ENCOUNTER — Ambulatory Visit
Admission: RE | Admit: 2021-10-26 | Discharge: 2021-10-26 | Disposition: A | Discharge status: Home / Self Care | Payer: BC Managed Care – PPO | Source: Ambulatory Visit | Attending: Surgery | Admitting: Surgery

## 2021-10-26 ENCOUNTER — Other Ambulatory Visit: Payer: Self-pay | Admitting: Surgery

## 2021-10-26 DIAGNOSIS — E041 Nontoxic single thyroid nodule: Secondary | ICD-10-CM

## 2021-10-26 DIAGNOSIS — R1310 Dysphagia, unspecified: Secondary | ICD-10-CM | POA: Diagnosis not present

## 2021-11-01 ENCOUNTER — Other Ambulatory Visit: Payer: Self-pay | Admitting: Medical Oncology

## 2021-11-01 ENCOUNTER — Other Ambulatory Visit: Payer: Self-pay

## 2021-11-01 ENCOUNTER — Inpatient Hospital Stay (HOSPITAL_BASED_OUTPATIENT_CLINIC_OR_DEPARTMENT_OTHER): Payer: BC Managed Care – PPO | Admitting: Internal Medicine

## 2021-11-01 ENCOUNTER — Inpatient Hospital Stay: Payer: BC Managed Care – PPO | Attending: Internal Medicine

## 2021-11-01 VITALS — BP 117/75 | HR 101 | Temp 97.4°F | Resp 20 | Ht 61.0 in | Wt 170.2 lb

## 2021-11-01 DIAGNOSIS — D4989 Neoplasm of unspecified behavior of other specified sites: Secondary | ICD-10-CM

## 2021-11-01 DIAGNOSIS — Z803 Family history of malignant neoplasm of breast: Secondary | ICD-10-CM | POA: Diagnosis not present

## 2021-11-01 DIAGNOSIS — I10 Essential (primary) hypertension: Secondary | ICD-10-CM | POA: Insufficient documentation

## 2021-11-01 DIAGNOSIS — D150 Benign neoplasm of thymus: Secondary | ICD-10-CM

## 2021-11-01 DIAGNOSIS — Z85238 Personal history of other malignant neoplasm of thymus: Secondary | ICD-10-CM

## 2021-11-01 LAB — CBC WITH DIFFERENTIAL (CANCER CENTER ONLY)
Abs Immature Granulocytes: 0.01 10*3/uL (ref 0.00–0.07)
Basophils Absolute: 0 10*3/uL (ref 0.0–0.1)
Basophils Relative: 1 %
Eosinophils Absolute: 0.3 10*3/uL (ref 0.0–0.5)
Eosinophils Relative: 5 %
HCT: 38 % (ref 36.0–46.0)
Hemoglobin: 12.5 g/dL (ref 12.0–15.0)
Immature Granulocytes: 0 %
Lymphocytes Relative: 30 %
Lymphs Abs: 1.5 10*3/uL (ref 0.7–4.0)
MCH: 29.9 pg (ref 26.0–34.0)
MCHC: 32.9 g/dL (ref 30.0–36.0)
MCV: 90.9 fL (ref 80.0–100.0)
Monocytes Absolute: 0.3 10*3/uL (ref 0.1–1.0)
Monocytes Relative: 7 %
Neutro Abs: 2.7 10*3/uL (ref 1.7–7.7)
Neutrophils Relative %: 57 %
Platelet Count: 238 10*3/uL (ref 150–400)
RBC: 4.18 MIL/uL (ref 3.87–5.11)
RDW: 14.1 % (ref 11.5–15.5)
WBC Count: 4.8 10*3/uL (ref 4.0–10.5)
nRBC: 0 % (ref 0.0–0.2)

## 2021-11-01 LAB — CMP (CANCER CENTER ONLY)
ALT: 17 U/L (ref 0–44)
AST: 14 U/L — ABNORMAL LOW (ref 15–41)
Albumin: 4.2 g/dL (ref 3.5–5.0)
Alkaline Phosphatase: 57 U/L (ref 38–126)
Anion gap: 7 (ref 5–15)
BUN: 17 mg/dL (ref 6–20)
CO2: 27 mmol/L (ref 22–32)
Calcium: 9.5 mg/dL (ref 8.9–10.3)
Chloride: 106 mmol/L (ref 98–111)
Creatinine: 1.25 mg/dL — ABNORMAL HIGH (ref 0.44–1.00)
GFR, Estimated: 50 mL/min — ABNORMAL LOW (ref >60)
Glucose, Bld: 129 mg/dL — ABNORMAL HIGH (ref 70–99)
Potassium: 3.8 mmol/L (ref 3.5–5.1)
Sodium: 140 mmol/L (ref 135–145)
Total Bilirubin: 0.6 mg/dL (ref 0.3–1.2)
Total Protein: 7.3 g/dL (ref 6.5–8.1)

## 2021-11-01 NOTE — Progress Notes (Signed)
Taylor Chen [phone]   [phone]  CONSULT NOTE  REFERRING PHYSICIAN: Dr. Modesto Charon  REASON FOR CONSULTATION:  61 years old African-American female with recently resected thymoma.  HPI Taylor Chen is a 61 y.o. female with past medical history significant for hypertension, heart murmur, prediabetes, depression, asthma, and anxiety, anemia.  The patient is a never smoker.  She presented to the emergency department on 06/29/2021 with left anterior chest pain and burning sensation.  She had CT angiogram of the chest performed at that time and it showed no evidence of pulmonary embolism but there was a partially calcified soft tissue mass within the anterior aspect of the superior mediastinum which may represent a thymoma.  There was 2.5 x 1.8 cm cyst within the left lobe of the thyroid gland.  The patient had MRI of the chest with and without contrast on 07/14/2021 and that showed 4.5 x 2.3 x 4.9 cm neoplasm in the anterior mediastinum with imaging characteristics of a thymic epithelial tumor, with differential consideration including both benign and malignant etiologies ranging from benign thymoma to malignant thymoma or thymic carcinoma.  The patient also underwent fine-needle aspiration of the suspicious thyroid lesion on August 12, 2021 and it was consistent with benign follicular nodule.  The patient was referred to Dr. Roxan Hockey and on August 16, 2021 she underwent robotic left VATS for resection of the anterior mediastinal mass with en bloc wedge of the left upper lobe and lymph node sampling.  The final pathology ([id number]) was consistent with thymoma type AB measuring 6.5 cm with close resection margin of 0.1 mm with the final pathological stage of pT1a, pN 0. The patient was referred to me today for evaluation and close monitoring of her condition. When seen today the patient is feeling fine with no concerning complaints except for mild  cough and occasional shortness of breath with exertion.  She has no chest pain or hemoptysis.  She lost around 15 pounds in the last few months.  She has no nausea, vomiting, diarrhea or constipation.  She has no headache or visual changes. Family history significant for mother with breast cancer at age 52 and she died from end-stage renal disease.  Father had hypertension, diabetes mellitus and kidney disease. The patient is single and has 1 daughter.  She works as a Network engineer of history at [employer]  She has no history for smoking, alcohol or drug abuse.  HPI  Past Medical History:  Diagnosis Date   Anemia    IN HER 20'S   Anginal pain ([place])    LAST COUPLE DAYS   Anxiety    Asthma    CHILDHOOD   Depression    Headache    MIGRAINES   Heart murmur    HAS BEEN TOLD   History of stress test 2012   Hypertension    CONTROLLED BY DIET   Pre-diabetes     Past Surgical History:  Procedure Laterality Date   DILATATION & CURETTAGE/HYSTEROSCOPY WITH MYOSURE N/A 08/25/2016   Procedure: DILATATION & CURETTAGE/HYSTEROSCOPY WITH MYOSURE;  Surgeon: Linda Hedges, DO;  Location: [facility];  Service: Gynecology;  Laterality: N/A;   HYSTOSCOPY  2010   INCISION AND DRAINAGE  1999   LYMPH NODE BIOPSY  08/16/2021   Procedure: LYMPH NODE BIOPSY;  Surgeon: Melrose Nakayama, MD;  Location: [facility];  Service: Thoracic;;    Family History  Problem Relation Age of Onset   Breast cancer Mother 11  No genetic testing    Social History Social History   Tobacco Use   Smoking status: Never   Smokeless tobacco: Never  Vaping Use   Vaping Use: Never used  Substance Use Topics   Alcohol use: No   Drug use: No    Allergies  Allergen Reactions   Hydrochlorothiazide Other (See Comments)    Increased urination (pt found disruptive to her teaching)   Lisinopril Cough   Metoprolol Other (See Comments)    Unknown reaction   Penicillins Other (See Comments)    Has patient had a PCN  reaction causing immediate rash, facial/tongue/throat swelling, SOB or lightheadedness with hypotension: no Has patient had a PCN reaction causing severe rash involving mucus membranes or skin necrosis: no Has patient had a PCN reaction that required hospitalization no Has patient had a PCN reaction occurring within the last 10 years: no If all of the above answers are "NO", then may proceed with Cephalosporin use. Reaction: yeast infection    Percocet [Oxycodone-Acetaminophen] Nausea And Vomiting    Current Outpatient Medications  Medication Sig Dispense Refill   amLODipine (NORVASC) 5 MG tablet Take 7.5 mg by mouth daily.     aspirin EC 81 MG tablet Take 81 mg by mouth daily. Swallow whole.     atorvastatin (LIPITOR) 20 MG tablet Take 20 mg by mouth at bedtime.     CALCIUM PO Take 1 tablet by mouth daily.     cetirizine (ZYRTEC) 10 MG tablet Take 10 mg by mouth daily as needed for allergies.     doxepin (SINEQUAN) 75 MG capsule Take 150 mg by mouth at bedtime.     HYDROcodone-acetaminophen (NORCO/VICODIN) 5-325 MG tablet Take 1 tablet by mouth every 4 (four) hours as needed for moderate pain. 30 tablet 0   latanoprost (XALATAN) 0.005 % ophthalmic solution Place 1 drop into both eyes at bedtime.     Multiple Vitamins-Minerals (ONE-A-DAY 50 PLUS PO) Take 1 tablet by mouth daily.     SUMAtriptan (IMITREX) 100 MG tablet Take 100 mg by mouth every 2 (two) hours as needed for migraine. May repeat in 2 hours if headache persists or recurs.     No current facility-administered medications for this visit.    Review of Systems  Constitutional: negative Eyes: negative Ears, nose, mouth, throat, and face: negative Respiratory: positive for cough and dyspnea on exertion Cardiovascular: negative Gastrointestinal: negative Genitourinary:negative Integument/breast: negative Hematologic/lymphatic: negative Musculoskeletal:negative Neurological: negative Behavioral/Psych: negative Endocrine:  negative Allergic/Immunologic: negative  Physical Exam  ZMO:QHUTM, healthy, no distress, well nourished, and well developed SKIN: skin color, texture, turgor are normal, no rashes or significant lesions HEAD: Normocephalic, No masses, lesions, tenderness or abnormalities EYES: normal, PERRLA, Conjunctiva are pink and non-injected EARS: External ears normal, Canals clear OROPHARYNX:no exudate, no erythema, and lips, buccal mucosa, and tongue normal  NECK: supple, no adenopathy, no JVD LYMPH:  no palpable lymphadenopathy, no hepatosplenomegaly BREAST:not examined LUNGS: clear to auscultation , and palpation HEART: regular rate & rhythm, no murmurs, and no gallops ABDOMEN:abdomen soft, non-tender, normal bowel sounds, and no masses or organomegaly BACK: Back symmetric, no curvature., No CVA tenderness EXTREMITIES:no joint deformities, effusion, or inflammation, no edema  NEURO: alert & oriented x 3 with fluent speech, no focal motor/sensory deficits  PERFORMANCE STATUS: ECOG 1  LABORATORY DATA: Lab Results  Component Value Date   WBC 4.8 11/01/2021   HGB 12.5 11/01/2021   HCT 38.0 11/01/2021   MCV 90.9 11/01/2021   PLT 238 11/01/2021  Chemistry      Component Value Date/Time   NA 138 08/18/2021 0108   K 4.0 08/18/2021 0108   CL 105 08/18/2021 0108   CO2 26 08/18/2021 0108   BUN 12 08/18/2021 0108   CREATININE 1.00 08/18/2021 0108   CREATININE 1.14 (H) 07/23/2021 1037      Component Value Date/Time   CALCIUM 8.8 (L) 08/18/2021 0108   ALKPHOS 66 08/18/2021 0108   AST 30 08/18/2021 0108   AST 40 07/23/2021 1037   ALT 28 08/18/2021 0108   ALT 104 (H) 07/23/2021 1037   BILITOT 0.5 08/18/2021 0108   BILITOT 0.3 07/23/2021 1037       RADIOGRAPHIC STUDIES: DG ESOPHAGUS W DOUBLE CM (HD)  Result Date: 10/26/2021 CLINICAL DATA:  Esophageal dysphagia. History of left thyroid nodule and thymoma resection recently. EXAM: ESOPHOGRAM / BARIUM SWALLOW / BARIUM TABLET STUDY  TECHNIQUE: Combined double contrast and single contrast examination performed using effervescent crystals, thick barium liquid, and thin barium liquid. The patient was observed with fluoroscopy swallowing a 13 mm barium sulphate tablet. FLUOROSCOPY TIME:  Fluoroscopy Time:  2 minutes and 6 seconds Radiation Exposure Index (if provided by the fluoroscopic device): 24 mGy Number of Acquired Spot Images: 0 COMPARISON:  Chest CT 06/30/2021 FINDINGS: Initial barium swallows demonstrate normal pharyngeal motion with swallowing. No laryngeal penetration or aspiration. No upper esophageal webs, strictures or diverticuli. There is slight rightward deviation of the cervical esophagus likely due to the left thyroid nodule. Nonspecific esophageal motility disorder with occasional disruption of the primary peristaltic wave and occasional tertiary contractions. No esophageal mass or stricture. No hiatal hernia or GE reflux demonstrated. IMPRESSION: 1. Esophageal dysmotility. 2. No esophageal mass or stricture. 3. Slight rightward deviation of the cervical esophagus likely due to the left thyroid nodule. Electronically Signed   By: Marijo Sanes M.D.   On: 10/26/2021 10:27    ASSESSMENT: This is a very pleasant 61 years old African-American female with stage I (T1 a, N0, M0) thymoma status post surgical resection with close margin under the care of Dr. Roxan Hockey on August 16, 2021.   PLAN: I had a lengthy discussion with the patient today about her current condition and treatment options. I explained to the patient that she had curable treatment for her condition and no additional treatment is needed at this point. Because of the close surgical margin, she will need to be monitored closely for any recurrence. I recommended for the patient to continue on observation with repeat CT scan of the chest in 1 year. She was advised to call immediately if she has any other concerning issues in the interval. The patient voices  understanding of current disease status and treatment options and is in agreement with the current care plan.  All questions were answered. The patient knows to call the clinic with any problems, questions or concerns. We can certainly see the patient much sooner if necessary.  Thank you so much for allowing me to participate in the care of Taylor Chen. I will continue to follow up the patient with you and assist in her care.  The total time spent in the appointment was 60 minutes.  Disclaimer: This note was dictated with voice recognition software. Similar sounding words can inadvertently be transcribed and may not be corrected upon review.   Taylor Chen November 01, 2021, 11:47 AM

## 2021-11-19 ENCOUNTER — Telehealth: Payer: Self-pay | Admitting: Internal Medicine

## 2021-11-19 NOTE — Telephone Encounter (Signed)
Sch per 1/16 los, pt aware °

## 2021-11-19 NOTE — Progress Notes (Signed)
Barium swallow does not show any intrinsic esophageal abnormality.  However, there is a nonspecific motility problem.  I think the patient would benefit from evaluation by speech pathology for swallow study.  Taylor Chen - please arrange referral to speech pathology for swallow evaluation.  [place], MD [facility] [place] practice Office: [phone]

## 2021-12-27 DIAGNOSIS — I10 Essential (primary) hypertension: Secondary | ICD-10-CM | POA: Diagnosis not present

## 2021-12-27 DIAGNOSIS — E7800 Pure hypercholesterolemia, unspecified: Secondary | ICD-10-CM | POA: Diagnosis not present

## 2021-12-27 DIAGNOSIS — D4989 Neoplasm of unspecified behavior of other specified sites: Secondary | ICD-10-CM | POA: Diagnosis not present

## 2021-12-27 DIAGNOSIS — R739 Hyperglycemia, unspecified: Secondary | ICD-10-CM | POA: Diagnosis not present

## 2022-01-18 ENCOUNTER — Telehealth: Payer: Self-pay | Admitting: Physician Assistant

## 2022-01-18 NOTE — Telephone Encounter (Signed)
Cancelled per 4/3 secure chat, diff provider seeing pt now. Attempted to call but voicemail was full.  ?

## 2022-01-19 ENCOUNTER — Inpatient Hospital Stay: Payer: BC Managed Care – PPO

## 2022-01-21 ENCOUNTER — Inpatient Hospital Stay: Payer: BC Managed Care – PPO | Admitting: Physician Assistant

## 2022-01-21 DIAGNOSIS — H25813 Combined forms of age-related cataract, bilateral: Secondary | ICD-10-CM | POA: Diagnosis not present

## 2022-01-21 DIAGNOSIS — H40023 Open angle with borderline findings, high risk, bilateral: Secondary | ICD-10-CM | POA: Diagnosis not present

## 2022-01-21 DIAGNOSIS — H04123 Dry eye syndrome of bilateral lacrimal glands: Secondary | ICD-10-CM | POA: Diagnosis not present

## 2022-01-31 ENCOUNTER — Other Ambulatory Visit: Payer: Self-pay

## 2022-01-31 ENCOUNTER — Encounter (HOSPITAL_COMMUNITY): Payer: Self-pay

## 2022-01-31 ENCOUNTER — Ambulatory Visit (HOSPITAL_COMMUNITY)
Admission: RE | Admit: 2022-01-31 | Discharge: 2022-01-31 | Disposition: A | Discharge status: Home / Self Care | Payer: BC Managed Care – PPO | Source: Ambulatory Visit | Attending: Physician Assistant | Admitting: Physician Assistant

## 2022-01-31 ENCOUNTER — Inpatient Hospital Stay: Payer: BC Managed Care – PPO | Attending: Internal Medicine

## 2022-01-31 DIAGNOSIS — I700 Atherosclerosis of aorta: Secondary | ICD-10-CM | POA: Diagnosis not present

## 2022-01-31 DIAGNOSIS — R918 Other nonspecific abnormal finding of lung field: Secondary | ICD-10-CM | POA: Diagnosis not present

## 2022-01-31 DIAGNOSIS — D4989 Neoplasm of unspecified behavior of other specified sites: Secondary | ICD-10-CM | POA: Diagnosis not present

## 2022-01-31 DIAGNOSIS — D150 Benign neoplasm of thymus: Secondary | ICD-10-CM

## 2022-01-31 LAB — CMP (CANCER CENTER ONLY)
ALT: 39 U/L (ref 0–44)
AST: 22 U/L (ref 15–41)
Albumin: 4.3 g/dL (ref 3.5–5.0)
Alkaline Phosphatase: 60 U/L (ref 38–126)
Anion gap: 4 — ABNORMAL LOW (ref 5–15)
BUN: 14 mg/dL (ref 6–20)
CO2: 31 mmol/L (ref 22–32)
Calcium: 9.7 mg/dL (ref 8.9–10.3)
Chloride: 104 mmol/L (ref 98–111)
Creatinine: 1.17 mg/dL — ABNORMAL HIGH (ref 0.44–1.00)
GFR, Estimated: 53 mL/min — ABNORMAL LOW (ref >60)
Glucose, Bld: 95 mg/dL (ref 70–99)
Potassium: 4.3 mmol/L (ref 3.5–5.1)
Sodium: 139 mmol/L (ref 135–145)
Total Bilirubin: 0.5 mg/dL (ref 0.3–1.2)
Total Protein: 7.4 g/dL (ref 6.5–8.1)

## 2022-01-31 LAB — CBC WITH DIFFERENTIAL (CANCER CENTER ONLY)
Abs Immature Granulocytes: 0.01 10*3/uL (ref 0.00–0.07)
Basophils Absolute: 0 10*3/uL (ref 0.0–0.1)
Basophils Relative: 1 %
Eosinophils Absolute: 0.1 10*3/uL (ref 0.0–0.5)
Eosinophils Relative: 1 %
HCT: 39.3 % (ref 36.0–46.0)
Hemoglobin: 12.3 g/dL (ref 12.0–15.0)
Immature Granulocytes: 0 %
Lymphocytes Relative: 28 %
Lymphs Abs: 1.4 10*3/uL (ref 0.7–4.0)
MCH: 28.7 pg (ref 26.0–34.0)
MCHC: 31.3 g/dL (ref 30.0–36.0)
MCV: 91.6 fL (ref 80.0–100.0)
Monocytes Absolute: 0.4 10*3/uL (ref 0.1–1.0)
Monocytes Relative: 7 %
Neutro Abs: 3.1 10*3/uL (ref 1.7–7.7)
Neutrophils Relative %: 63 %
Platelet Count: 383 10*3/uL (ref 150–400)
RBC: 4.29 MIL/uL (ref 3.87–5.11)
RDW: 13.8 % (ref 11.5–15.5)
WBC Count: 5 10*3/uL (ref 4.0–10.5)
nRBC: 0 % (ref 0.0–0.2)

## 2022-01-31 MED ORDER — IOHEXOL 300 MG/ML  SOLN
75.0000 mL | Freq: Once | INTRAMUSCULAR | Status: AC | PRN
  Administered 2022-01-31: 75 mL via INTRAVENOUS

## 2022-01-31 MED ORDER — SODIUM CHLORIDE (PF) 0.9 % IJ SOLN
INTRAMUSCULAR | Status: AC
  Filled 2022-01-31: qty 50

## 2022-02-28 ENCOUNTER — Other Ambulatory Visit: Payer: Self-pay | Admitting: Thoracic Surgery (Cardiothoracic Vascular Surgery)

## 2022-02-28 DIAGNOSIS — J9859 Other diseases of mediastinum, not elsewhere classified: Secondary | ICD-10-CM

## 2022-03-01 ENCOUNTER — Ambulatory Visit: Payer: BC Managed Care – PPO | Admitting: Thoracic Surgery (Cardiothoracic Vascular Surgery)

## 2022-03-02 ENCOUNTER — Encounter: Payer: Self-pay | Admitting: Thoracic Surgery (Cardiothoracic Vascular Surgery)

## 2022-03-30 DIAGNOSIS — H04123 Dry eye syndrome of bilateral lacrimal glands: Secondary | ICD-10-CM | POA: Diagnosis not present

## 2022-03-30 DIAGNOSIS — H524 Presbyopia: Secondary | ICD-10-CM | POA: Diagnosis not present

## 2022-04-08 ENCOUNTER — Encounter: Payer: Self-pay | Admitting: *Deleted

## 2022-04-26 ENCOUNTER — Encounter: Payer: Self-pay | Admitting: Thoracic Surgery (Cardiothoracic Vascular Surgery)

## 2022-04-26 ENCOUNTER — Ambulatory Visit: Payer: BC Managed Care – PPO | Admitting: Thoracic Surgery (Cardiothoracic Vascular Surgery)

## 2022-04-26 ENCOUNTER — Ambulatory Visit
Admission: RE | Admit: 2022-04-26 | Discharge: 2022-04-26 | Disposition: A | Discharge status: Home / Self Care | Payer: BC Managed Care – PPO | Source: Ambulatory Visit | Attending: Thoracic Surgery (Cardiothoracic Vascular Surgery) | Admitting: Thoracic Surgery (Cardiothoracic Vascular Surgery)

## 2022-04-26 VITALS — BP 120/75 | HR 100 | Resp 20 | Ht 61.0 in | Wt 172.0 lb

## 2022-04-26 DIAGNOSIS — J9859 Other diseases of mediastinum, not elsewhere classified: Secondary | ICD-10-CM

## 2022-04-26 DIAGNOSIS — C37 Malignant neoplasm of thymus: Secondary | ICD-10-CM | POA: Diagnosis not present

## 2022-04-26 DIAGNOSIS — R222 Localized swelling, mass and lump, trunk: Secondary | ICD-10-CM | POA: Diagnosis not present

## 2022-04-26 NOTE — Progress Notes (Signed)
[address]       [place] [address]             [phone]       HPI: Taylor Chen returns for a scheduled follow-up visit  Taylor Chen is a 61 year old woman with a history of hypertension, heart murmur, hyperlipidemia, migraines, anxiety, thyroid nodule, and type A thymoma.  She had a robotic thymectomy done through the left chest on 08/16/2021.  She did well postoperatively and went home on day 2.  I saw her in the office on 09/14/2021.  She was doing well at that time.  She was not having any issues with shortness of breath.  Chest x-ray showed some elevation of the left hemidiaphragm.  That has been present since the surgery.  In the interim since her last visit she has been feeling well.  She saw Dr. Julien Nordmann back in May.  She had a CT of the chest.  There was some fluid/fat in the anterior mediastinum but nothing suspicious for recurrence.  Past Medical History:  Diagnosis Date   Anemia    IN HER 20'S   Anginal pain (HCC)    LAST COUPLE DAYS   Anxiety    Asthma    CHILDHOOD   Depression    Headache    MIGRAINES   Heart murmur    HAS BEEN TOLD   History of stress test 2012   Hypertension    CONTROLLED BY DIET   Pre-diabetes     Current Outpatient Medications  Medication Sig Dispense Refill   amLODipine (NORVASC) 5 MG tablet Take 7.5 mg by mouth daily.     aspirin EC 81 MG tablet Take 81 mg by mouth daily. Swallow whole.     atorvastatin (LIPITOR) 20 MG tablet Take 20 mg by mouth at bedtime.     CALCIUM PO Take 1 tablet by mouth daily.     cetirizine (ZYRTEC) 10 MG tablet Take 10 mg by mouth daily as needed for allergies.     doxepin (SINEQUAN) 75 MG capsule Take 150 mg by mouth at bedtime.     latanoprost (XALATAN) 0.005 % ophthalmic solution Place 1 drop into both eyes at bedtime.     Multiple Vitamins-Minerals (ONE-A-DAY 50 PLUS PO) Take 1 tablet by mouth daily.     SUMAtriptan (IMITREX) 100 MG tablet Take 100 mg by mouth every 2 (two)  hours as needed for migraine. May repeat in 2 hours if headache persists or recurs.     HYDROcodone-acetaminophen (NORCO/VICODIN) 5-325 MG tablet Take 1 tablet by mouth every 4 (four) hours as needed for moderate pain. (Patient not taking: Reported on 04/26/2022) 30 tablet 0   No current facility-administered medications for this visit.    Physical Exam BP 120/75   Pulse 100   Resp 20   Ht '5\' 1"'$  (1.549 m)   Wt 172 lb (78 kg)   SpO2 98%   BMI 32.81 kg/m  62 year old woman in no acute distress Alert and oriented x3 with no focal deficits Lungs slightly diminished at left base but otherwise clear Cardiac regular rate and rhythm  Diagnostic Tests: CHEST - 2 VIEW   COMPARISON:  CT of the abdomen scratched at CT of the chest with contrast 01/31/2022. Two-view chest x-ray 09/14/2021.   FINDINGS: Heart size is normal. Chronic elevation of left hemidiaphragm is stable. Anterior mediastinum is clear. Given the size of the mediastinal lesion on the prior study, the residual soft tissue  is likely only visible by CT.   Lungs are otherwise clear bilaterally. The visualized soft tissues and bony thorax are unremarkable.   IMPRESSION: 1. No acute cardiopulmonary disease or significant interval change. 2. Chronic elevation of the left hemidiaphragm. 3. The mediastinal mass seen on the prior study is likely only visible by CT.     Electronically Signed   By: San Morelle M.D.   On: 04/26/2022 12:52 I personally reviewed the chest x-ray images.  There is still some elevation of left hemidiaphragm although it does appear improved from previous images.  Impression: Taylor Chen is a 61 year old woman with a history of hypertension, heart murmur, hyperlipidemia, migraines, anxiety, thyroid nodule, and type A thymoma.  She had a robotic thymectomy done through the left chest on 08/16/2021.  Type A thymoma-status post thymectomy.  Was in close proximity to the left phrenic nerve.   Following up with Dr. Julien Nordmann.  Scheduled to have another CT in January.  Elevation of left hemidiaphragm-appears slightly improved but still elevated compared to her most recent chest x-rays.  Asymptomatic.  No intervention indicated.  Plan: Follow-up as scheduled with Dr. Julien Nordmann Return in 6 months (after CT)  Melrose Nakayama, MD [facility] [phone]

## 2022-06-24 DIAGNOSIS — H6992 Unspecified Eustachian tube disorder, left ear: Secondary | ICD-10-CM | POA: Diagnosis not present

## 2022-07-06 DIAGNOSIS — L819 Disorder of pigmentation, unspecified: Secondary | ICD-10-CM | POA: Diagnosis not present

## 2022-07-06 DIAGNOSIS — F419 Anxiety disorder, unspecified: Secondary | ICD-10-CM | POA: Diagnosis not present

## 2022-07-07 DIAGNOSIS — F411 Generalized anxiety disorder: Secondary | ICD-10-CM | POA: Diagnosis not present

## 2022-07-14 DIAGNOSIS — F411 Generalized anxiety disorder: Secondary | ICD-10-CM | POA: Diagnosis not present

## 2022-07-20 DIAGNOSIS — F411 Generalized anxiety disorder: Secondary | ICD-10-CM | POA: Diagnosis not present

## 2022-08-03 DIAGNOSIS — F411 Generalized anxiety disorder: Secondary | ICD-10-CM | POA: Diagnosis not present

## 2022-08-10 DIAGNOSIS — F411 Generalized anxiety disorder: Secondary | ICD-10-CM | POA: Diagnosis not present

## 2022-08-15 DIAGNOSIS — H524 Presbyopia: Secondary | ICD-10-CM | POA: Diagnosis not present

## 2022-08-15 DIAGNOSIS — H04123 Dry eye syndrome of bilateral lacrimal glands: Secondary | ICD-10-CM | POA: Diagnosis not present

## 2022-08-15 DIAGNOSIS — H40023 Open angle with borderline findings, high risk, bilateral: Secondary | ICD-10-CM | POA: Diagnosis not present

## 2022-08-17 DIAGNOSIS — F411 Generalized anxiety disorder: Secondary | ICD-10-CM | POA: Diagnosis not present

## 2022-08-24 DIAGNOSIS — F411 Generalized anxiety disorder: Secondary | ICD-10-CM | POA: Diagnosis not present

## 2022-08-29 DIAGNOSIS — F439 Reaction to severe stress, unspecified: Secondary | ICD-10-CM | POA: Diagnosis not present

## 2022-08-31 DIAGNOSIS — F411 Generalized anxiety disorder: Secondary | ICD-10-CM | POA: Diagnosis not present

## 2022-09-06 DIAGNOSIS — F419 Anxiety disorder, unspecified: Secondary | ICD-10-CM | POA: Diagnosis not present

## 2022-09-06 DIAGNOSIS — K219 Gastro-esophageal reflux disease without esophagitis: Secondary | ICD-10-CM | POA: Diagnosis not present

## 2022-09-06 DIAGNOSIS — I10 Essential (primary) hypertension: Secondary | ICD-10-CM | POA: Diagnosis not present

## 2022-09-07 DIAGNOSIS — F411 Generalized anxiety disorder: Secondary | ICD-10-CM | POA: Diagnosis not present

## 2022-09-14 DIAGNOSIS — F411 Generalized anxiety disorder: Secondary | ICD-10-CM | POA: Diagnosis not present

## 2022-09-21 DIAGNOSIS — F411 Generalized anxiety disorder: Secondary | ICD-10-CM | POA: Diagnosis not present

## 2022-09-28 DIAGNOSIS — F411 Generalized anxiety disorder: Secondary | ICD-10-CM | POA: Diagnosis not present

## 2022-10-05 DIAGNOSIS — F411 Generalized anxiety disorder: Secondary | ICD-10-CM | POA: Diagnosis not present

## 2022-10-19 DIAGNOSIS — F411 Generalized anxiety disorder: Secondary | ICD-10-CM | POA: Diagnosis not present

## 2022-10-26 DIAGNOSIS — F411 Generalized anxiety disorder: Secondary | ICD-10-CM | POA: Diagnosis not present

## 2022-10-28 ENCOUNTER — Other Ambulatory Visit: Payer: Self-pay

## 2022-10-28 DIAGNOSIS — D4989 Neoplasm of unspecified behavior of other specified sites: Secondary | ICD-10-CM

## 2022-10-31 ENCOUNTER — Inpatient Hospital Stay: Payer: BC Managed Care – PPO | Attending: Internal Medicine

## 2022-10-31 ENCOUNTER — Inpatient Hospital Stay: Payer: BC Managed Care – PPO | Admitting: Internal Medicine

## 2022-11-02 DIAGNOSIS — F411 Generalized anxiety disorder: Secondary | ICD-10-CM | POA: Diagnosis not present

## 2022-11-07 ENCOUNTER — Other Ambulatory Visit: Payer: Self-pay | Admitting: Thoracic Surgery (Cardiothoracic Vascular Surgery)

## 2022-11-07 DIAGNOSIS — R911 Solitary pulmonary nodule: Secondary | ICD-10-CM

## 2022-11-08 ENCOUNTER — Ambulatory Visit: Payer: BC Managed Care – PPO | Admitting: Thoracic Surgery (Cardiothoracic Vascular Surgery)

## 2022-11-08 ENCOUNTER — Encounter: Payer: Self-pay | Admitting: Thoracic Surgery (Cardiothoracic Vascular Surgery)

## 2022-11-09 DIAGNOSIS — F411 Generalized anxiety disorder: Secondary | ICD-10-CM | POA: Diagnosis not present

## 2022-11-16 DIAGNOSIS — F411 Generalized anxiety disorder: Secondary | ICD-10-CM | POA: Diagnosis not present

## 2022-11-21 DIAGNOSIS — F419 Anxiety disorder, unspecified: Secondary | ICD-10-CM | POA: Diagnosis not present

## 2022-11-21 DIAGNOSIS — I10 Essential (primary) hypertension: Secondary | ICD-10-CM | POA: Diagnosis not present

## 2022-11-21 DIAGNOSIS — R351 Nocturia: Secondary | ICD-10-CM | POA: Diagnosis not present

## 2022-11-21 DIAGNOSIS — E785 Hyperlipidemia, unspecified: Secondary | ICD-10-CM | POA: Diagnosis not present

## 2022-11-21 DIAGNOSIS — L500 Allergic urticaria: Secondary | ICD-10-CM | POA: Diagnosis not present

## 2022-11-23 DIAGNOSIS — F411 Generalized anxiety disorder: Secondary | ICD-10-CM | POA: Diagnosis not present

## 2022-11-29 DIAGNOSIS — F411 Generalized anxiety disorder: Secondary | ICD-10-CM | POA: Diagnosis not present

## 2022-12-02 DIAGNOSIS — I10 Essential (primary) hypertension: Secondary | ICD-10-CM | POA: Diagnosis not present

## 2022-12-02 DIAGNOSIS — Z131 Encounter for screening for diabetes mellitus: Secondary | ICD-10-CM | POA: Diagnosis not present

## 2022-12-02 DIAGNOSIS — E785 Hyperlipidemia, unspecified: Secondary | ICD-10-CM | POA: Diagnosis not present

## 2022-12-02 DIAGNOSIS — R8290 Unspecified abnormal findings in urine: Secondary | ICD-10-CM | POA: Diagnosis not present

## 2022-12-07 DIAGNOSIS — F411 Generalized anxiety disorder: Secondary | ICD-10-CM | POA: Diagnosis not present

## 2022-12-14 DIAGNOSIS — F411 Generalized anxiety disorder: Secondary | ICD-10-CM | POA: Diagnosis not present

## 2022-12-21 DIAGNOSIS — F411 Generalized anxiety disorder: Secondary | ICD-10-CM | POA: Diagnosis not present

## 2022-12-28 DIAGNOSIS — F411 Generalized anxiety disorder: Secondary | ICD-10-CM | POA: Diagnosis not present

## 2023-01-04 DIAGNOSIS — F411 Generalized anxiety disorder: Secondary | ICD-10-CM | POA: Diagnosis not present

## 2023-01-11 DIAGNOSIS — F411 Generalized anxiety disorder: Secondary | ICD-10-CM | POA: Diagnosis not present

## 2023-01-18 DIAGNOSIS — F411 Generalized anxiety disorder: Secondary | ICD-10-CM | POA: Diagnosis not present

## 2023-01-25 DIAGNOSIS — F411 Generalized anxiety disorder: Secondary | ICD-10-CM | POA: Diagnosis not present

## 2023-02-01 DIAGNOSIS — F411 Generalized anxiety disorder: Secondary | ICD-10-CM | POA: Diagnosis not present

## 2023-02-08 DIAGNOSIS — F411 Generalized anxiety disorder: Secondary | ICD-10-CM | POA: Diagnosis not present

## 2023-02-15 DIAGNOSIS — F411 Generalized anxiety disorder: Secondary | ICD-10-CM | POA: Diagnosis not present

## 2023-02-22 DIAGNOSIS — F411 Generalized anxiety disorder: Secondary | ICD-10-CM | POA: Diagnosis not present

## 2023-02-28 DIAGNOSIS — N1831 Chronic kidney disease, stage 3a: Secondary | ICD-10-CM | POA: Diagnosis not present

## 2023-02-28 DIAGNOSIS — E7849 Other hyperlipidemia: Secondary | ICD-10-CM | POA: Diagnosis not present

## 2023-02-28 DIAGNOSIS — R7303 Prediabetes: Secondary | ICD-10-CM | POA: Diagnosis not present

## 2023-03-01 DIAGNOSIS — F411 Generalized anxiety disorder: Secondary | ICD-10-CM | POA: Diagnosis not present

## 2023-03-02 DIAGNOSIS — E785 Hyperlipidemia, unspecified: Secondary | ICD-10-CM | POA: Diagnosis not present

## 2023-03-02 DIAGNOSIS — Z23 Encounter for immunization: Secondary | ICD-10-CM | POA: Diagnosis not present

## 2023-03-02 DIAGNOSIS — F419 Anxiety disorder, unspecified: Secondary | ICD-10-CM | POA: Diagnosis not present

## 2023-03-02 DIAGNOSIS — Z1211 Encounter for screening for malignant neoplasm of colon: Secondary | ICD-10-CM | POA: Diagnosis not present

## 2023-03-02 DIAGNOSIS — E041 Nontoxic single thyroid nodule: Secondary | ICD-10-CM | POA: Diagnosis not present

## 2023-03-02 DIAGNOSIS — R351 Nocturia: Secondary | ICD-10-CM | POA: Diagnosis not present

## 2023-03-02 DIAGNOSIS — I10 Essential (primary) hypertension: Secondary | ICD-10-CM | POA: Diagnosis not present

## 2023-03-02 DIAGNOSIS — Z124 Encounter for screening for malignant neoplasm of cervix: Secondary | ICD-10-CM | POA: Diagnosis not present

## 2023-03-02 DIAGNOSIS — Z0001 Encounter for general adult medical examination with abnormal findings: Secondary | ICD-10-CM | POA: Diagnosis not present

## 2023-03-03 ENCOUNTER — Other Ambulatory Visit: Payer: Self-pay | Admitting: Internal Medicine

## 2023-03-03 DIAGNOSIS — E041 Nontoxic single thyroid nodule: Secondary | ICD-10-CM

## 2023-03-06 ENCOUNTER — Other Ambulatory Visit: Payer: Self-pay | Admitting: Internal Medicine

## 2023-03-06 DIAGNOSIS — Z1231 Encounter for screening mammogram for malignant neoplasm of breast: Secondary | ICD-10-CM

## 2023-03-08 DIAGNOSIS — F411 Generalized anxiety disorder: Secondary | ICD-10-CM | POA: Diagnosis not present

## 2023-03-09 ENCOUNTER — Other Ambulatory Visit: Payer: Self-pay | Admitting: Internal Medicine

## 2023-03-09 DIAGNOSIS — Z13820 Encounter for screening for osteoporosis: Secondary | ICD-10-CM

## 2023-03-15 DIAGNOSIS — F411 Generalized anxiety disorder: Secondary | ICD-10-CM | POA: Diagnosis not present

## 2023-03-22 ENCOUNTER — Encounter (HOSPITAL_COMMUNITY): Payer: Self-pay

## 2023-03-22 ENCOUNTER — Emergency Department (HOSPITAL_COMMUNITY): Payer: BC Managed Care – PPO

## 2023-03-22 ENCOUNTER — Other Ambulatory Visit: Payer: Self-pay

## 2023-03-22 ENCOUNTER — Emergency Department (HOSPITAL_COMMUNITY)
Admission: EM | Admit: 2023-03-22 | Discharge: 2023-03-22 | Disposition: A | Discharge status: Home / Self Care | Payer: BC Managed Care – PPO | Attending: Emergency Medicine | Admitting: Emergency Medicine

## 2023-03-22 DIAGNOSIS — Z043 Encounter for examination and observation following other accident: Secondary | ICD-10-CM | POA: Diagnosis not present

## 2023-03-22 DIAGNOSIS — R0789 Other chest pain: Secondary | ICD-10-CM | POA: Diagnosis not present

## 2023-03-22 DIAGNOSIS — F411 Generalized anxiety disorder: Secondary | ICD-10-CM | POA: Diagnosis not present

## 2023-03-22 DIAGNOSIS — R109 Unspecified abdominal pain: Secondary | ICD-10-CM | POA: Insufficient documentation

## 2023-03-22 DIAGNOSIS — W19XXXA Unspecified fall, initial encounter: Secondary | ICD-10-CM

## 2023-03-22 DIAGNOSIS — Z7982 Long term (current) use of aspirin: Secondary | ICD-10-CM | POA: Insufficient documentation

## 2023-03-22 DIAGNOSIS — I10 Essential (primary) hypertension: Secondary | ICD-10-CM | POA: Insufficient documentation

## 2023-03-22 DIAGNOSIS — W010XXA Fall on same level from slipping, tripping and stumbling without subsequent striking against object, initial encounter: Secondary | ICD-10-CM | POA: Diagnosis not present

## 2023-03-22 DIAGNOSIS — S2232XA Fracture of one rib, left side, initial encounter for closed fracture: Secondary | ICD-10-CM | POA: Insufficient documentation

## 2023-03-22 DIAGNOSIS — Z79899 Other long term (current) drug therapy: Secondary | ICD-10-CM | POA: Insufficient documentation

## 2023-03-22 DIAGNOSIS — S20321A Blister (nonthermal) of right front wall of thorax, initial encounter: Secondary | ICD-10-CM | POA: Diagnosis not present

## 2023-03-22 DIAGNOSIS — M79672 Pain in left foot: Secondary | ICD-10-CM | POA: Diagnosis not present

## 2023-03-22 LAB — BASIC METABOLIC PANEL
Anion gap: 9 (ref 5–15)
BUN: 13 mg/dL (ref 8–23)
CO2: 26 mmol/L (ref 22–32)
Calcium: 9.1 mg/dL (ref 8.9–10.3)
Chloride: 106 mmol/L (ref 98–111)
Creatinine, Ser: 1.02 mg/dL — ABNORMAL HIGH (ref 0.44–1.00)
GFR, Estimated: >60 mL/min (ref >60)
Glucose, Bld: 106 mg/dL — ABNORMAL HIGH (ref 70–99)
Potassium: 3.6 mmol/L (ref 3.5–5.1)
Sodium: 141 mmol/L (ref 135–145)

## 2023-03-22 MED ORDER — SODIUM CHLORIDE (PF) 0.9 % IJ SOLN
INTRAMUSCULAR | Status: AC
  Filled 2023-03-22: qty 50

## 2023-03-22 MED ORDER — LIDOCAINE 5 % EX PTCH
1.0000 | MEDICATED_PATCH | CUTANEOUS | Status: DC
  Administered 2023-03-22: 1 via TRANSDERMAL
  Filled 2023-03-22: qty 1

## 2023-03-22 MED ORDER — HYDROCODONE-ACETAMINOPHEN 5-325 MG PO TABS
0.5000 | ORAL_TABLET | Freq: Four times a day (QID) | ORAL | 0 refills | Status: DC | PRN
Start: 2023-03-22 — End: 2023-12-28

## 2023-03-22 MED ORDER — IOHEXOL 300 MG/ML  SOLN
100.0000 mL | Freq: Once | INTRAMUSCULAR | Status: AC | PRN
  Administered 2023-03-22: 100 mL via INTRAVENOUS

## 2023-03-22 MED ORDER — IBUPROFEN 200 MG PO TABS
600.0000 mg | ORAL_TABLET | Freq: Once | ORAL | Status: AC
  Administered 2023-03-22: 600 mg via ORAL
  Filled 2023-03-22: qty 3

## 2023-03-22 MED ORDER — FENTANYL CITRATE PF 50 MCG/ML IJ SOSY
50.0000 ug | PREFILLED_SYRINGE | Freq: Once | INTRAMUSCULAR | Status: AC
  Administered 2023-03-22: 50 ug via INTRAVENOUS
  Filled 2023-03-22: qty 1

## 2023-03-22 NOTE — ED Triage Notes (Signed)
Pt arrives c/o L sided flank/side pain. States that she tripped and fell into the ceramic toilet the other night and hit her side. States that she's having difficulty sleeping due to the pain. No meds prior to arrival. Denies dysuria or hematuria.

## 2023-03-22 NOTE — ED Notes (Signed)
Provided pt with incentive spirometer and instructed on use. Pt provided return demonstration, no further questions.

## 2023-03-22 NOTE — ED Provider Notes (Signed)
[place] EMERGENCY DEPARTMENT AT [facility] Provider Note   CSN: [id number] Arrival date & time: 03/22/23  2725     History Chief Complaint  Patient presents with   Flank Pain    Taylor Chen is a 62 y.o. female with history of hypertension, hyperlipidemia, anxiety depression, prediabetes presents emergency room today for evaluation of left-sided chest/abdominal pain status post fall.  Patient reports that on Monday night she tripped and fell and hit her left flank on the toilet.  She did not hit her head.  She denies any head, neck, back pain.  She does complain of some left toe pain that she sustained during the fall as well.  She denies any shortness of breath but does report some worsening pain with deep breathing.  No chest pain.  No lightheadedness.  Denies any headaches or any blurry vision.  No numbness or tingling to the areas.  She is on any blood thinners but does take a daily baby aspirin.  She is allergic to penicillin and Percocet.  Denies any tobacco, EtOH, or illicit drug use.   Flank Pain Associated symptoms include chest pain and abdominal pain. Pertinent negatives include no headaches and no shortness of breath.       Home Medications Prior to Admission medications   Medication Sig Start Date End Date Taking? Authorizing Provider  amLODipine (NORVASC) 5 MG tablet Take 7.5 mg by mouth daily. 06/28/21   [provider]  aspirin EC 81 MG tablet Take 81 mg by mouth daily. Swallow whole.    [provider]  atorvastatin (LIPITOR) 20 MG tablet Take 20 mg by mouth at bedtime.    [provider]  CALCIUM PO Take 1 tablet by mouth daily.    [provider]  cetirizine (ZYRTEC) 10 MG tablet Take 10 mg by mouth daily as needed for allergies.    [provider]  doxepin (SINEQUAN) 75 MG capsule Take 150 mg by mouth at bedtime. 06/23/21   [provider]  latanoprost (XALATAN) 0.005 % ophthalmic solution Place 1  drop into both eyes at bedtime.    [provider]  Multiple Vitamins-Minerals (ONE-A-DAY 50 PLUS PO) Take 1 tablet by mouth daily.    [provider]  SUMAtriptan (IMITREX) 100 MG tablet Take 100 mg by mouth every 2 (two) hours as needed for migraine. May repeat in 2 hours if headache persists or recurs.    [provider]      Allergies    Hydrochlorothiazide, Lisinopril, Metoprolol, Penicillins, and Percocet [oxycodone-acetaminophen]    Review of Systems   Review of Systems  Constitutional:  Negative for chills and fever.  Eyes:  Negative for visual disturbance.  Respiratory:  Negative for shortness of breath.   Cardiovascular:  Positive for chest pain.  Gastrointestinal:  Positive for abdominal pain. Negative for constipation, diarrhea, nausea and vomiting.  Genitourinary:  Positive for flank pain. Negative for dysuria and hematuria.  Musculoskeletal:  Positive for arthralgias and myalgias.  Neurological:  Negative for weakness, light-headedness and headaches.    Physical Exam Updated Vital Signs BP 125/79 (BP Location: Right Arm)   Pulse (!) 108   Temp 98.5 F (36.9 C) (Oral)   Resp 18   SpO2 96%  Physical Exam Vitals and nursing note reviewed.  Constitutional:      General: She is not in acute distress.    Appearance: Normal appearance. She is not ill-appearing or toxic-appearing.  HENT:     Head: Normocephalic  and atraumatic.  Eyes:     General: No scleral icterus. Cardiovascular:     Rate and Rhythm: Normal rate and regular rhythm.  Pulmonary:     Effort: Pulmonary effort is normal. No respiratory distress.     Breath sounds: Normal breath sounds.  Chest:       Comments: Tenderness to the above marked area.  I do not appreciate any significant step-off or deformity.  No flail chest noted.  No overlying crepitus.  No overlying warmth erythema, or any lesions or overlying skin changes noted.  No overlying ecchymosis. Abdominal:      General: Abdomen is flat. Bowel sounds are normal.     Palpations: Abdomen is soft.     Tenderness: There is abdominal tenderness. There is no guarding or rebound.       Comments: Tenderness to the marked above area.   I do not appreciate any significant step-off or deformity.  No flail chest noted.  No overlying crepitus.  No overlying warmth erythema, or any lesions or overlying skin changes noted.  No overlying ecchymosis.   Musculoskeletal:        General: No deformity.     Cervical back: Normal range of motion.     Comments: Tenderness and some bruising seen to the middle, fourth, and fifth toe on the left foot.  No pain into the lateral foot or into the ankle.  Some bruising noted more to the third toe.  Cap refill brisk on all 5 toes.  Palpable DP and PT pulses.  Compartments are soft.  She has no other tenderness to her extremities upper and lower bilaterally.  She does not have any midline cervical, thoracic, or lumbar tenderness to palpation.  No step-offs or deformities noted.  She is moving all extremities and is ambulatory.   Skin:    General: Skin is warm and dry.  Neurological:     General: No focal deficit present.     Mental Status: She is alert. Mental status is at baseline.     ED Results / Procedures / Treatments   Labs (all labs ordered are listed, but only abnormal results are displayed) Labs Reviewed  BASIC METABOLIC PANEL - Abnormal; Notable for the following components:      Result Value   Glucose, Bld 106 (*)    Creatinine, Ser 1.02 (*)    All other components within normal limits    EKG None  Radiology DG Foot Complete Left  Result Date: 03/22/2023 CLINICAL DATA:  Pain and injury to the lateral toes. EXAM: LEFT FOOT - COMPLETE 3 VIEW COMPARISON:  None Available. FINDINGS: No acute fracture dislocation. Degenerative spurring at the heel and first toe. Peroneal ossicles which are corticated. IMPRESSION: No acute finding. Electronically Signed   By: Tiburcio Pea M.D.   On: 03/22/2023 05:50    Procedures Procedures   Medications Ordered in ED Medications  lidocaine (LIDODERM) 5 % 1 patch (1 patch Transdermal Patch Applied 03/22/23 0603)  fentaNYL (SUBLIMAZE) injection 50 mcg (50 mcg Intravenous Given 03/22/23 0603)    ED Course/ Medical Decision Making/ A&P   Medical Decision Making Amount and/or Complexity of Data Reviewed Labs: ordered. Radiology: ordered.  Risk Prescription drug management.   62 y.o. female presents to the ER for evaluation of left sided chest and abdominal pain s/p fall. Differential diagnosis includes but is not limited to intra-abdominal trauma, intrathoracic trauma, sprain, strain, fracture, dislocation. Vital signs show slight tachycardia however otherwise unremarkable. Physical exam as  noted above.   Fentanyl ordered for pain.  Lidocaine patch ordered as well.  I independently reviewed and interpreted the patient's labs. BMP shows mildly elevated glucose at 106. Creatinine at 1.02 but seems to be improved from her baseline.   XR of the foot shows no acute finding.  CT of the chest and abdomen pending at this time. Will have oncoming shift follow up with results and determine disposition.   6:16 AM Care of [facility] transferred to [employer] at the end of my shift as the patient will require reassessment once labs/imaging have resulted. Patient presentation, ED course, and plan of care discussed with review of all pertinent labs and imaging. Please see his/her note for further details regarding further ED course and disposition. Plan at time of handoff is follow up on CT imaging. Re-evaluate. Disposition to be determined at this time. This may be altered or completely changed at the discretion of the oncoming team pending results of further workup.  Portions of this report may have been transcribed using voice recognition software. Every effort was made to ensure accuracy; however, inadvertent computerized  transcription errors may be present.   Final Clinical Impression(s) / ED Diagnoses Final diagnoses:  None    Rx / DC Orders ED Discharge Orders     None         Achille Rich, PA-C 03/22/23 1610    Shon Baton, MD 03/22/23 [phone]

## 2023-03-22 NOTE — Discharge Instructions (Signed)
Please read and follow all provided instructions.  Your diagnoses today include:  1. Closed fracture of one rib of left side, initial encounter   2. Fall, initial encounter     Tests performed today include: The CT scan of your chest abdomen pelvis shows that you do have a rib fracture low on the left side, no other severe internal injuries noted Vital signs. See below for your results today.   Medications prescribed:  Vicodin (hydrocodone/acetaminophen) - narcotic pain medication  DO NOT drive or perform any activities that require you to be awake and alert because this medicine can make you drowsy. BE VERY CAREFUL not to take multiple medicines containing Tylenol (also called acetaminophen). Doing so can lead to an overdose which can damage your liver and cause liver failure and possibly death.  Use pain medication only under direct supervision at the lowest possible dose needed to control your pain.   Take any prescribed medications only as directed.  Home care instructions:  Follow any educational materials contained in this packet.  BE VERY CAREFUL not to take multiple medicines containing Tylenol (also called acetaminophen). Doing so can lead to an overdose which can damage your liver and cause liver failure and possibly death.   Follow-up instructions: Please follow-up with your primary care provider in the next 7 days for further evaluation of your symptoms.   Return instructions:  Please return to the Emergency Department if you experience worsening symptoms.  Please return if you have any other emergent concerns.  Additional Information:  Your vital signs today were: BP (!) 131/93   Pulse 74   Temp 98.5 F (36.9 C) (Oral)   Resp 16   SpO2 96%  If your blood pressure (BP) was elevated above 135/85 this visit, please have this repeated by your doctor within one month. --------------

## 2023-03-22 NOTE — ED Provider Notes (Signed)
Signout from [place] PA-C at shift change. Briefly, patient presents for left-sided mid back and flank pain after a fall.   Plan: CT imaging to evaluate chest, abdomen, pelvis.   8:08 AM Reassessment performed. Patient appears   Labs and imaging personally reviewed and interpreted including: CT chest, abdomen, pelvis agree posterior rib fracture noted, BMP agree unremarkable.   Reviewed additional pertinent lab work and imaging with patient at bedside.   Most current vital signs reviewed and are as follows: BP (!) 131/93   Pulse 74   Temp 98.5 F (36.9 C) (Oral)   Resp 16   SpO2 96%   Plan: Discharge to home   Home treatment: # 5 tablets hydrocodone-acetaminophen prescribed for pain control, however patient states that she would like to try pain control with acetaminophen and ibuprofen as able.  She is also given an incentive spirometer and asked to use this 10 times an hour while awake.   Return and follow-up instructions: Encouraged return to ED with uncontrolled symptoms, shortness of breath difficulty breathing. Encouraged patient to follow-up with their provider in 7 days. Patient verbalized understanding and agreed with plan.       Renne Crigler, PA-C 03/22/23 0454    Bethann Berkshire, MD 03/23/23 [phone]

## 2023-03-29 DIAGNOSIS — F411 Generalized anxiety disorder: Secondary | ICD-10-CM | POA: Diagnosis not present

## 2023-03-30 DIAGNOSIS — R109 Unspecified abdominal pain: Secondary | ICD-10-CM | POA: Diagnosis not present

## 2023-03-30 DIAGNOSIS — J9811 Atelectasis: Secondary | ICD-10-CM | POA: Diagnosis not present

## 2023-03-30 DIAGNOSIS — I10 Essential (primary) hypertension: Secondary | ICD-10-CM | POA: Diagnosis not present

## 2023-03-30 DIAGNOSIS — S2232XA Fracture of one rib, left side, initial encounter for closed fracture: Secondary | ICD-10-CM | POA: Diagnosis not present

## 2023-04-03 ENCOUNTER — Ambulatory Visit
Admission: RE | Admit: 2023-04-03 | Discharge: 2023-04-03 | Disposition: A | Discharge status: Home / Self Care | Payer: BC Managed Care – PPO | Source: Ambulatory Visit | Attending: Internal Medicine | Admitting: Internal Medicine

## 2023-04-03 DIAGNOSIS — E041 Nontoxic single thyroid nodule: Secondary | ICD-10-CM | POA: Diagnosis not present

## 2023-04-05 DIAGNOSIS — F411 Generalized anxiety disorder: Secondary | ICD-10-CM | POA: Diagnosis not present

## 2023-04-12 DIAGNOSIS — F411 Generalized anxiety disorder: Secondary | ICD-10-CM | POA: Diagnosis not present

## 2023-04-19 DIAGNOSIS — F411 Generalized anxiety disorder: Secondary | ICD-10-CM | POA: Diagnosis not present

## 2023-04-26 DIAGNOSIS — F411 Generalized anxiety disorder: Secondary | ICD-10-CM | POA: Diagnosis not present

## 2023-05-03 DIAGNOSIS — F411 Generalized anxiety disorder: Secondary | ICD-10-CM | POA: Diagnosis not present

## 2023-05-10 DIAGNOSIS — F411 Generalized anxiety disorder: Secondary | ICD-10-CM | POA: Diagnosis not present

## 2023-05-17 DIAGNOSIS — F411 Generalized anxiety disorder: Secondary | ICD-10-CM | POA: Diagnosis not present

## 2023-05-24 DIAGNOSIS — Z01419 Encounter for gynecological examination (general) (routine) without abnormal findings: Secondary | ICD-10-CM | POA: Diagnosis not present

## 2023-05-24 DIAGNOSIS — Z6833 Body mass index (BMI) 33.0-33.9, adult: Secondary | ICD-10-CM | POA: Diagnosis not present

## 2023-05-24 DIAGNOSIS — Z124 Encounter for screening for malignant neoplasm of cervix: Secondary | ICD-10-CM | POA: Diagnosis not present

## 2023-05-24 DIAGNOSIS — Z1231 Encounter for screening mammogram for malignant neoplasm of breast: Secondary | ICD-10-CM | POA: Diagnosis not present

## 2023-05-24 DIAGNOSIS — F411 Generalized anxiety disorder: Secondary | ICD-10-CM | POA: Diagnosis not present

## 2023-05-30 DIAGNOSIS — R0781 Pleurodynia: Secondary | ICD-10-CM | POA: Diagnosis not present

## 2023-05-30 DIAGNOSIS — M79672 Pain in left foot: Secondary | ICD-10-CM | POA: Diagnosis not present

## 2023-05-30 DIAGNOSIS — S2232XA Fracture of one rib, left side, initial encounter for closed fracture: Secondary | ICD-10-CM | POA: Diagnosis not present

## 2023-05-31 DIAGNOSIS — F411 Generalized anxiety disorder: Secondary | ICD-10-CM | POA: Diagnosis not present

## 2023-06-07 DIAGNOSIS — Z1212 Encounter for screening for malignant neoplasm of rectum: Secondary | ICD-10-CM | POA: Diagnosis not present

## 2023-06-07 DIAGNOSIS — Z1211 Encounter for screening for malignant neoplasm of colon: Secondary | ICD-10-CM | POA: Diagnosis not present

## 2023-06-07 DIAGNOSIS — F411 Generalized anxiety disorder: Secondary | ICD-10-CM | POA: Diagnosis not present

## 2023-06-14 DIAGNOSIS — F411 Generalized anxiety disorder: Secondary | ICD-10-CM | POA: Diagnosis not present

## 2023-06-17 LAB — EXTERNAL GENERIC LAB PROCEDURE: COLOGUARD: NEGATIVE

## 2023-06-21 DIAGNOSIS — F411 Generalized anxiety disorder: Secondary | ICD-10-CM | POA: Diagnosis not present

## 2023-06-23 ENCOUNTER — Encounter: Payer: BC Managed Care – PPO | Attending: Internal Medicine | Admitting: Dietician

## 2023-06-23 ENCOUNTER — Encounter: Payer: Self-pay | Admitting: Dietician

## 2023-06-23 DIAGNOSIS — Z6833 Body mass index (BMI) 33.0-33.9, adult: Secondary | ICD-10-CM | POA: Diagnosis not present

## 2023-06-23 DIAGNOSIS — E6609 Other obesity due to excess calories: Secondary | ICD-10-CM | POA: Insufficient documentation

## 2023-06-23 DIAGNOSIS — Z713 Dietary counseling and surveillance: Secondary | ICD-10-CM | POA: Insufficient documentation

## 2023-06-23 NOTE — Patient Instructions (Addendum)
Stress Goal: do something artistic (craft, paint) for 1 hour at least once a week.   Food Goal: follow the "Plate Method" twice per day (include 1/2 plate non-starchy vegetables, 1/4 plate protein, and 1/4 plate complex carbs.)  Consider having your Vitamin D check.   Add soymilk to your smoothie instead of water for protein.   Add a protein to your oatmeal: cook in soymilk, nuts or seeds, boiled egg on side.

## 2023-06-23 NOTE — Progress Notes (Signed)
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 She states work days her eating is more consistent. She states on these days she does a 1 hour walk but on other days she walks for 2 hours. Pt states she just got back into walking.   Pt states she can't lose weight. She states she did a vegan diet for 19 weeks and didn't lose weight. She states she was eating a lot of the vegan meats (vegan burgers, beef tips, vegan cheese, etc). Pt states she feels like she gained weight over the last few years specifically in her breasts and abdomen.   Pt states she has a daughter with conduct disorder and reactive attachment disorder who brought her a lot of stress and states she is in a group home now which has reduced her stress but she also has a father with dementia which brings her stress.   Pt states she sleeps from 9pm-3am and then gets up to walk her dog and has to leave for work by [employer]. She states she has to wake up frequently to urinate.   Pt states she drinks 2 green drinks in the morning (spinach, water, frozen fruit). On work days she then has oatmeal with splenda and vegan butter.   Estimated daily fluid intake: 48  oz Supplements: MVI, calcium Sleep: 6 hours of sleep (wakes up to pee) Stress / self-care: high stress Current average weekly physical activity: walking 6 hours per week  24-Hr Dietary Recall First Meal:  smoothie with spinach, water, frozen fruit and a little later (on work days) has oatmeal with vegan butter and sugar Snack: none Second Meal: spaghetti with broccoli and sun dried tomatoes Snack: none Third Meal: bbq rib sandwich and broccoli Snack: fig newton and oatmeal cookie Beverages: coffee or tea, water   NUTRITION DIAGNOSIS  NB-1.1 Food and nutrition-related knowledge deficit As related to lack of prior nutrition education by a nutrition professional.  As evidenced by pt report.   NUTRITION INTERVENTION  Nutrition education (E-1) on the following topics:  Fruits & Vegetables: Aim to fill half your plate with a variety of fruits and vegetables. They are rich in vitamins, minerals, and fiber, and can help reduce the risk of chronic diseases. Choose a colorful assortment of fruits and vegetables to ensure you get a wide range of nutrients. Grains and Starches: Make at least half of your grain choices whole grains, such as brown rice, whole wheat bread, and oats. Whole grains provide fiber, which aids in digestion and healthy cholesterol levels. Aim for whole forms of starchy vegetables such as potatoes, sweet potatoes, beans, peas, and corn, which are fiber rich and provide many vitamins and minerals.  Protein: Incorporate lean sources of protein, such as poultry, fish, beans, nuts, and seeds, into your meals. Protein is essential for building and repairing tissues, staying full, balancing blood sugar, as well as supporting immune function. Dairy: Include low-fat or fat-free dairy products like milk, yogurt, and cheese  in your diet. Dairy foods are excellent sources of calcium and vitamin D, which are crucial for bone health.  Physical Activity: Aim for 60 minutes of physical activity daily. Regular physical activity promotes overall health-including helping to reduce risk for heart disease and diabetes, promoting mental health, and helping [place] sleep better.   Plant proteins offer numerous  health benefits, including being nutrient-rich, supporting heart and digestive health, and lowering the risk of chronic diseases like obesity, diabetes, and certain cancers. They are generally lower in saturated fats and cholesterol compared to animal proteins and are packed with fiber, antioxidants, and essential vitamins. Consuming plant proteins also promotes better blood sugar control, aids in weight management due to their high fiber content, and contributes to a healthier gut. Additionally, choosing plant proteins supports environmental sustainability, making them a more eco-friendly option. Common sources of plant proteins include legumes (such as lentils, chickpeas, and beans), nuts and seeds (like almonds, chia, and hemp seeds), and whole grains (such as quinoa and oats). Tofu, tempeh, and edamame are soy-based options rich in protein and essential amino acids, while seitan offers a meat-like texture. These versatile options make it easy to incorporate plant proteins into a balanced diet.  Handouts Provided Include  Plate Method  Learning Style & Readiness for Change Teaching method utilized: Visual & Auditory  Demonstrated degree of understanding via: Teach Back  Barriers to learning/adherence to lifestyle change: none  Goals Established by Pt  Stress Goal: do something artistic (craft, paint) for 1 hour at least once a week.   Food Goal: follow the "Plate Method" twice per day (include 1/2 plate non-starchy vegetables, 1/4 plate protein, and 1/4 plate complex carbs.)  Consider having your Vitamin D check.   Add soymilk to your smoothie instead of water for protein.   Add a protein to your oatmeal: cook in soymilk, nuts or seeds, boiled egg on side.    MONITORING & EVALUATION Dietary intake, weekly physical activity, and follow up in 2 months.  Next Steps  Patient is to call for questions.

## 2023-06-28 DIAGNOSIS — F411 Generalized anxiety disorder: Secondary | ICD-10-CM | POA: Diagnosis not present

## 2023-06-30 DIAGNOSIS — E785 Hyperlipidemia, unspecified: Secondary | ICD-10-CM | POA: Diagnosis not present

## 2023-06-30 DIAGNOSIS — Z131 Encounter for screening for diabetes mellitus: Secondary | ICD-10-CM | POA: Diagnosis not present

## 2023-06-30 DIAGNOSIS — I10 Essential (primary) hypertension: Secondary | ICD-10-CM | POA: Diagnosis not present

## 2023-07-05 DIAGNOSIS — F411 Generalized anxiety disorder: Secondary | ICD-10-CM | POA: Diagnosis not present

## 2023-07-12 DIAGNOSIS — F411 Generalized anxiety disorder: Secondary | ICD-10-CM | POA: Diagnosis not present

## 2023-07-19 DIAGNOSIS — F411 Generalized anxiety disorder: Secondary | ICD-10-CM | POA: Diagnosis not present

## 2023-07-26 DIAGNOSIS — F411 Generalized anxiety disorder: Secondary | ICD-10-CM | POA: Diagnosis not present

## 2023-08-02 DIAGNOSIS — F411 Generalized anxiety disorder: Secondary | ICD-10-CM | POA: Diagnosis not present

## 2023-08-09 DIAGNOSIS — F411 Generalized anxiety disorder: Secondary | ICD-10-CM | POA: Diagnosis not present

## 2023-08-09 DIAGNOSIS — R7401 Elevation of levels of liver transaminase levels: Secondary | ICD-10-CM | POA: Diagnosis not present

## 2023-08-09 DIAGNOSIS — M545 Low back pain, unspecified: Secondary | ICD-10-CM | POA: Diagnosis not present

## 2023-08-09 DIAGNOSIS — Z6834 Body mass index (BMI) 34.0-34.9, adult: Secondary | ICD-10-CM | POA: Diagnosis not present

## 2023-08-16 DIAGNOSIS — F411 Generalized anxiety disorder: Secondary | ICD-10-CM | POA: Diagnosis not present

## 2023-08-23 DIAGNOSIS — F411 Generalized anxiety disorder: Secondary | ICD-10-CM | POA: Diagnosis not present

## 2023-08-30 ENCOUNTER — Ambulatory Visit: Payer: BC Managed Care – PPO | Admitting: Dietician

## 2023-08-30 DIAGNOSIS — F411 Generalized anxiety disorder: Secondary | ICD-10-CM | POA: Diagnosis not present

## 2023-09-04 ENCOUNTER — Ambulatory Visit: Payer: BC Managed Care – PPO | Attending: Internal Medicine | Admitting: Physical Therapy

## 2023-09-04 NOTE — Therapy (Deleted)
OUTPATIENT PHYSICAL THERAPY THORACOLUMBAR EVALUATION   Patient Name: Taylor Chen MRN: [id number] DOB:[birth year], 62 y.o., female Today's Date: 09/04/2023  END OF SESSION:   Past Medical History:  Diagnosis Date   Anemia    IN HER 20'S   Anginal pain (HCC)    LAST COUPLE DAYS   Anxiety    Asthma    CHILDHOOD   Depression    Headache    MIGRAINES   Heart murmur    HAS BEEN TOLD   History of stress test 2012   Hypertension    CONTROLLED BY DIET   Pre-diabetes    Past Surgical History:  Procedure Laterality Date   DILATATION & CURETTAGE/HYSTEROSCOPY WITH MYOSURE N/A 08/25/2016   Procedure: DILATATION & CURETTAGE/HYSTEROSCOPY WITH MYOSURE;  Surgeon: Mitchel Honour, DO;  Location: [facility];  Service: Gynecology;  Laterality: N/A;   HYSTOSCOPY  2010   INCISION AND DRAINAGE  1999   LYMPH NODE BIOPSY  08/16/2021   Procedure: LYMPH NODE BIOPSY;  Surgeon: Loreli Slot, MD;  Location: [facility] OR;  Service: Thoracic;;   Patient Active Problem List   Diagnosis Date Noted   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  ONSET DATE: ***  SUBJECTIVE:                                                                                                                                                                                           SUBJECTIVE STATEMENT: ***  PERTINENT HISTORY:  Anxiety; HTN; Depression   PAIN:  Are you having pain? Yes: NPRS scale: ***/10 Pain location: *** Pain description: *** Aggravating factors: *** Relieving factors: ***  PRECAUTIONS: {Therapy precautions:24002}  RED FLAGS: {PT Red Flags:29287}   WEIGHT BEARING RESTRICTIONS: {Yes ***/No:24003}  FALLS:   Has patient fallen in last 6 months? {fallsyesno:27318}  LIVING ENVIRONMENT: Lives with: {[facility] lives with:25569::"lives with their family"} Lives in: {Lives in:25570} Stairs: {opstairs:27293} Has following equipment at home: {Assistive devices:23999}  OCCUPATION: ***  PLOF: {PLOF:24004}  PATIENT GOALS: ***  NEXT MD VISIT: ***  OBJECTIVE:  Note: Objective measures were completed at Evaluation unless otherwise noted.  DIAGNOSTIC FINDINGS:  ***  PATIENT SURVEYS:  {rehab surveys:24030}  SCREENING FOR RED FLAGS: Bowel or bladder incontinence: {Yes/No:304960894} Spinal tumors: {Yes/No:304960894} Cauda equina syndrome: {Yes/No:304960894} Compression fracture: {Yes/No:304960894} Abdominal aneurysm: {Yes/No:304960894}  COGNITION: Overall cognitive status: {cognition:24006}  SENSATION: {sensation:27233}  MUSCLE LENGTH: Hamstrings: Right *** deg; Left *** deg Maisie Fus test: Right *** deg; Left *** deg  POSTURE: {posture:25561}  PALPATION: ***  LUMBAR ROM:   AROM eval  Flexion   Extension   Right lateral flexion   Left lateral flexion   Right rotation   Left rotation    (Blank rows = not tested)  LOWER EXTREMITY ROM:     {AROM/PROM:27142}  Right eval Left eval  Hip flexion    Hip extension    Hip abduction    Hip adduction    Hip internal rotation    Hip external rotation    Knee flexion    Knee extension    Ankle dorsiflexion    Ankle plantarflexion    Ankle inversion    Ankle eversion     (Blank rows = not tested)  LOWER EXTREMITY MMT:    MMT Right eval Left eval  Hip flexion    Hip extension    Hip abduction    Hip adduction    Hip internal rotation    Hip external rotation    Knee flexion    Knee extension    Ankle dorsiflexion    Ankle plantarflexion    Ankle inversion    Ankle eversion     (Blank rows = not tested)  LUMBAR SPECIAL TESTS:  {lumbar special test:25242}  FUNCTIONAL TESTS:  {Functional  tests:24029}  GAIT: Distance walked: *** Assistive device utilized: {Assistive devices:23999} Level of assistance: {Levels of assistance:24026} Comments: ***  TODAY'S TREATMENT:                                                                                                                              DATE: ***    PATIENT EDUCATION:  Education details: *** Person educated: {Person educated:25204} Education method: {Education Method:25205} Education comprehension: {Education Comprehension:25206}  HOME EXERCISE PROGRAM: ***  ASSESSMENT:  CLINICAL IMPRESSION: Patient is a *** y.o. *** who was seen today for physical therapy evaluation and treatment for ***.   OBJECTIVE IMPAIRMENTS: {opptimpairments:25111}.   ACTIVITY LIMITATIONS: {activitylimitations:27494}  PARTICIPATION LIMITATIONS: {participationrestrictions:25113}  PERSONAL FACTORS: {Personal factors:25162} are also affecting patient's functional outcome.   REHAB POTENTIAL: {rehabpotential:25112}  CLINICAL DECISION MAKING: {clinical decision making:25114}  EVALUATION COMPLEXITY: {Evaluation complexity:25115}   GOALS: Goals reviewed with patient? {yes/no:20286}  SHORT TERM GOALS: Target date: ***  *** Baseline: Goal status: INITIAL  2.  *** Baseline:  Goal status: INITIAL  3.  *** Baseline:  Goal status: INITIAL  4.  *** Baseline:  Goal status: INITIAL  5.  *** Baseline:  Goal status: INITIAL  6.  *** Baseline:  Goal status: INITIAL  LONG TERM GOALS: Target date: ***  *** Baseline:  Goal status: INITIAL  2.  *** Baseline:  Goal status: INITIAL  3.  *** Baseline:  Goal status: INITIAL  4.  *** Baseline:  Goal status: INITIAL  5.  *** Baseline:  Goal status: INITIAL  6.  ***  Baseline:  Goal status: INITIAL  PLAN:  PT FREQUENCY: {rehab frequency:25116}  PT DURATION: {rehab duration:25117}  PLANNED INTERVENTIONS: {rehab planned interventions:25118::"97110-Therapeutic  exercises","97530- Therapeutic [id number]- Neuromuscular re-education","97535- Self UUVO","53664- Manual therapy"}.  PLAN FOR NEXT SESSION: Claude Manges, PT 09/04/2023, 7:48 AM

## 2023-09-06 DIAGNOSIS — F411 Generalized anxiety disorder: Secondary | ICD-10-CM | POA: Diagnosis not present

## 2023-09-13 DIAGNOSIS — F411 Generalized anxiety disorder: Secondary | ICD-10-CM | POA: Diagnosis not present

## 2023-09-20 DIAGNOSIS — F411 Generalized anxiety disorder: Secondary | ICD-10-CM | POA: Diagnosis not present

## 2023-09-27 DIAGNOSIS — F411 Generalized anxiety disorder: Secondary | ICD-10-CM | POA: Diagnosis not present
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
## 2023-10-04 ENCOUNTER — Encounter: Payer: Self-pay | Admitting: Physical Therapy

## 2023-10-04 ENCOUNTER — Other Ambulatory Visit: Payer: Self-pay

## 2023-10-04 ENCOUNTER — Ambulatory Visit: Payer: BC Managed Care – PPO | Attending: Internal Medicine | Admitting: Physical Therapy

## 2023-10-04 DIAGNOSIS — M62838 Other muscle spasm: Secondary | ICD-10-CM | POA: Insufficient documentation

## 2023-10-04 DIAGNOSIS — R293 Abnormal posture: Secondary | ICD-10-CM | POA: Diagnosis not present

## 2023-10-04 DIAGNOSIS — M6281 Muscle weakness (generalized): Secondary | ICD-10-CM | POA: Insufficient documentation

## 2023-10-04 DIAGNOSIS — F411 Generalized anxiety disorder: Secondary | ICD-10-CM | POA: Diagnosis not present

## 2023-10-04 DIAGNOSIS — M5459 Other low back pain: Secondary | ICD-10-CM | POA: Diagnosis not present

## 2023-10-13 DIAGNOSIS — F411 Generalized anxiety disorder: Secondary | ICD-10-CM | POA: Diagnosis not present

## 2023-10-17 DIAGNOSIS — F411 Generalized anxiety disorder: Secondary | ICD-10-CM | POA: Diagnosis not present

## 2023-10-20 DIAGNOSIS — F411 Generalized anxiety disorder: Secondary | ICD-10-CM | POA: Diagnosis not present

## 2023-10-23 ENCOUNTER — Ambulatory Visit: Payer: BC Managed Care – PPO | Attending: Internal Medicine | Admitting: Physical Therapy

## 2023-10-23 DIAGNOSIS — M62838 Other muscle spasm: Secondary | ICD-10-CM | POA: Insufficient documentation

## 2023-10-23 DIAGNOSIS — M6281 Muscle weakness (generalized): Secondary | ICD-10-CM | POA: Insufficient documentation

## 2023-10-23 DIAGNOSIS — M5459 Other low back pain: Secondary | ICD-10-CM | POA: Insufficient documentation

## 2023-10-23 DIAGNOSIS — R293 Abnormal posture: Secondary | ICD-10-CM | POA: Insufficient documentation

## 2023-10-24 DIAGNOSIS — N1831 Chronic kidney disease, stage 3a: Secondary | ICD-10-CM | POA: Diagnosis not present

## 2023-10-24 DIAGNOSIS — R7303 Prediabetes: Secondary | ICD-10-CM | POA: Diagnosis not present

## 2023-10-25 ENCOUNTER — Ambulatory Visit: Payer: BC Managed Care – PPO | Admitting: Dermatology

## 2023-10-25 ENCOUNTER — Encounter: Payer: Self-pay | Admitting: Dermatology

## 2023-10-25 DIAGNOSIS — D2372 Other benign neoplasm of skin of left lower limb, including hip: Secondary | ICD-10-CM | POA: Diagnosis not present

## 2023-10-25 DIAGNOSIS — Z6834 Body mass index (BMI) 34.0-34.9, adult: Secondary | ICD-10-CM | POA: Diagnosis not present

## 2023-10-25 DIAGNOSIS — E6609 Other obesity due to excess calories: Secondary | ICD-10-CM | POA: Diagnosis not present

## 2023-10-25 DIAGNOSIS — N1831 Chronic kidney disease, stage 3a: Secondary | ICD-10-CM | POA: Diagnosis not present

## 2023-10-25 DIAGNOSIS — F411 Generalized anxiety disorder: Secondary | ICD-10-CM | POA: Diagnosis not present

## 2023-10-25 DIAGNOSIS — R7401 Elevation of levels of liver transaminase levels: Secondary | ICD-10-CM | POA: Diagnosis not present

## 2023-10-25 DIAGNOSIS — D2362 Other benign neoplasm of skin of left upper limb, including shoulder: Secondary | ICD-10-CM | POA: Diagnosis not present

## 2023-10-25 DIAGNOSIS — D239 Other benign neoplasm of skin, unspecified: Secondary | ICD-10-CM

## 2023-10-25 NOTE — Progress Notes (Signed)
   New Patient Visit   Subjective  Taylor Chen is a 63 y.o. female who presents for the following: spots of concern on left arm and left lower leg, present for years, not changing or painful.  No hx or family hx of skin cancer.  The patient has spots, moles and lesions to be evaluated, some may be new or changing.   The following portions of the chart were reviewed this encounter and updated as appropriate: medications, allergies, medical history  Review of Systems:  No other skin or systemic complaints except as noted in HPI or Assessment and Plan.  Objective  Well appearing patient in no apparent distress; mood and affect are within normal limits.  A focused examination was performed of the following areas:  Left arm and left leg  Relevant exam findings are noted in the Assessment and Plan.  Left Forearm - Anterior, Left Lower Leg - Anterior Return for surgical removal if you want them removed  Assessment & Plan   DERMATOFIBROMA (2) Left Forearm - Anterior, Left Lower Leg - Anterior  DERMATOFIBROMAx 2 Exam: Firm pink/brown papulenodule with dimple sign. Treatment Plan: A dermatofibroma is a benign growth possibly related to trauma, such as an insect bite, cut from shaving, or inflamed acne-type bump.  Treatment options to remove include shave or excision with resulting scar and risk of recurrence.   Patient prefers excision, will schedule  Return for excision of Derm.  LILLETTE Berwyn Baseman, Surg Tech III, am acting as scribe for RUFUS CHRISTELLA HOLY, MD.   Documentation: I have reviewed the above documentation for accuracy and completeness, and I agree with the above.  RUFUS CHRISTELLA HOLY, MD

## 2023-10-25 NOTE — Patient Instructions (Signed)

## 2023-10-26 DIAGNOSIS — H40023 Open angle with borderline findings, high risk, bilateral: Secondary | ICD-10-CM | POA: Diagnosis not present

## 2023-10-26 DIAGNOSIS — H04123 Dry eye syndrome of bilateral lacrimal glands: Secondary | ICD-10-CM | POA: Diagnosis not present

## 2023-10-26 DIAGNOSIS — H25813 Combined forms of age-related cataract, bilateral: Secondary | ICD-10-CM | POA: Diagnosis not present

## 2023-10-26 DIAGNOSIS — H524 Presbyopia: Secondary | ICD-10-CM | POA: Diagnosis not present

## 2023-10-27 ENCOUNTER — Ambulatory Visit: Payer: BC Managed Care – PPO | Admitting: Physical Therapy

## 2023-10-27 ENCOUNTER — Encounter: Payer: Self-pay | Admitting: Physical Therapy

## 2023-10-27 DIAGNOSIS — M6281 Muscle weakness (generalized): Secondary | ICD-10-CM

## 2023-10-27 DIAGNOSIS — M62838 Other muscle spasm: Secondary | ICD-10-CM | POA: Diagnosis not present

## 2023-10-27 DIAGNOSIS — M5459 Other low back pain: Secondary | ICD-10-CM | POA: Diagnosis not present

## 2023-10-27 DIAGNOSIS — R293 Abnormal posture: Secondary | ICD-10-CM | POA: Diagnosis not present
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
## 2023-10-29 IMAGING — DX DG CHEST 1V PORT
1 series · 1 of 1 positions shown · non-contrast
Comparison: Portable exam 6621 hours compared to CT angio chest
06/30/2021

CLINICAL DATA: Mediastinal mass

EXAM:
PORTABLE CHEST 1 VIEW

[chest ap]
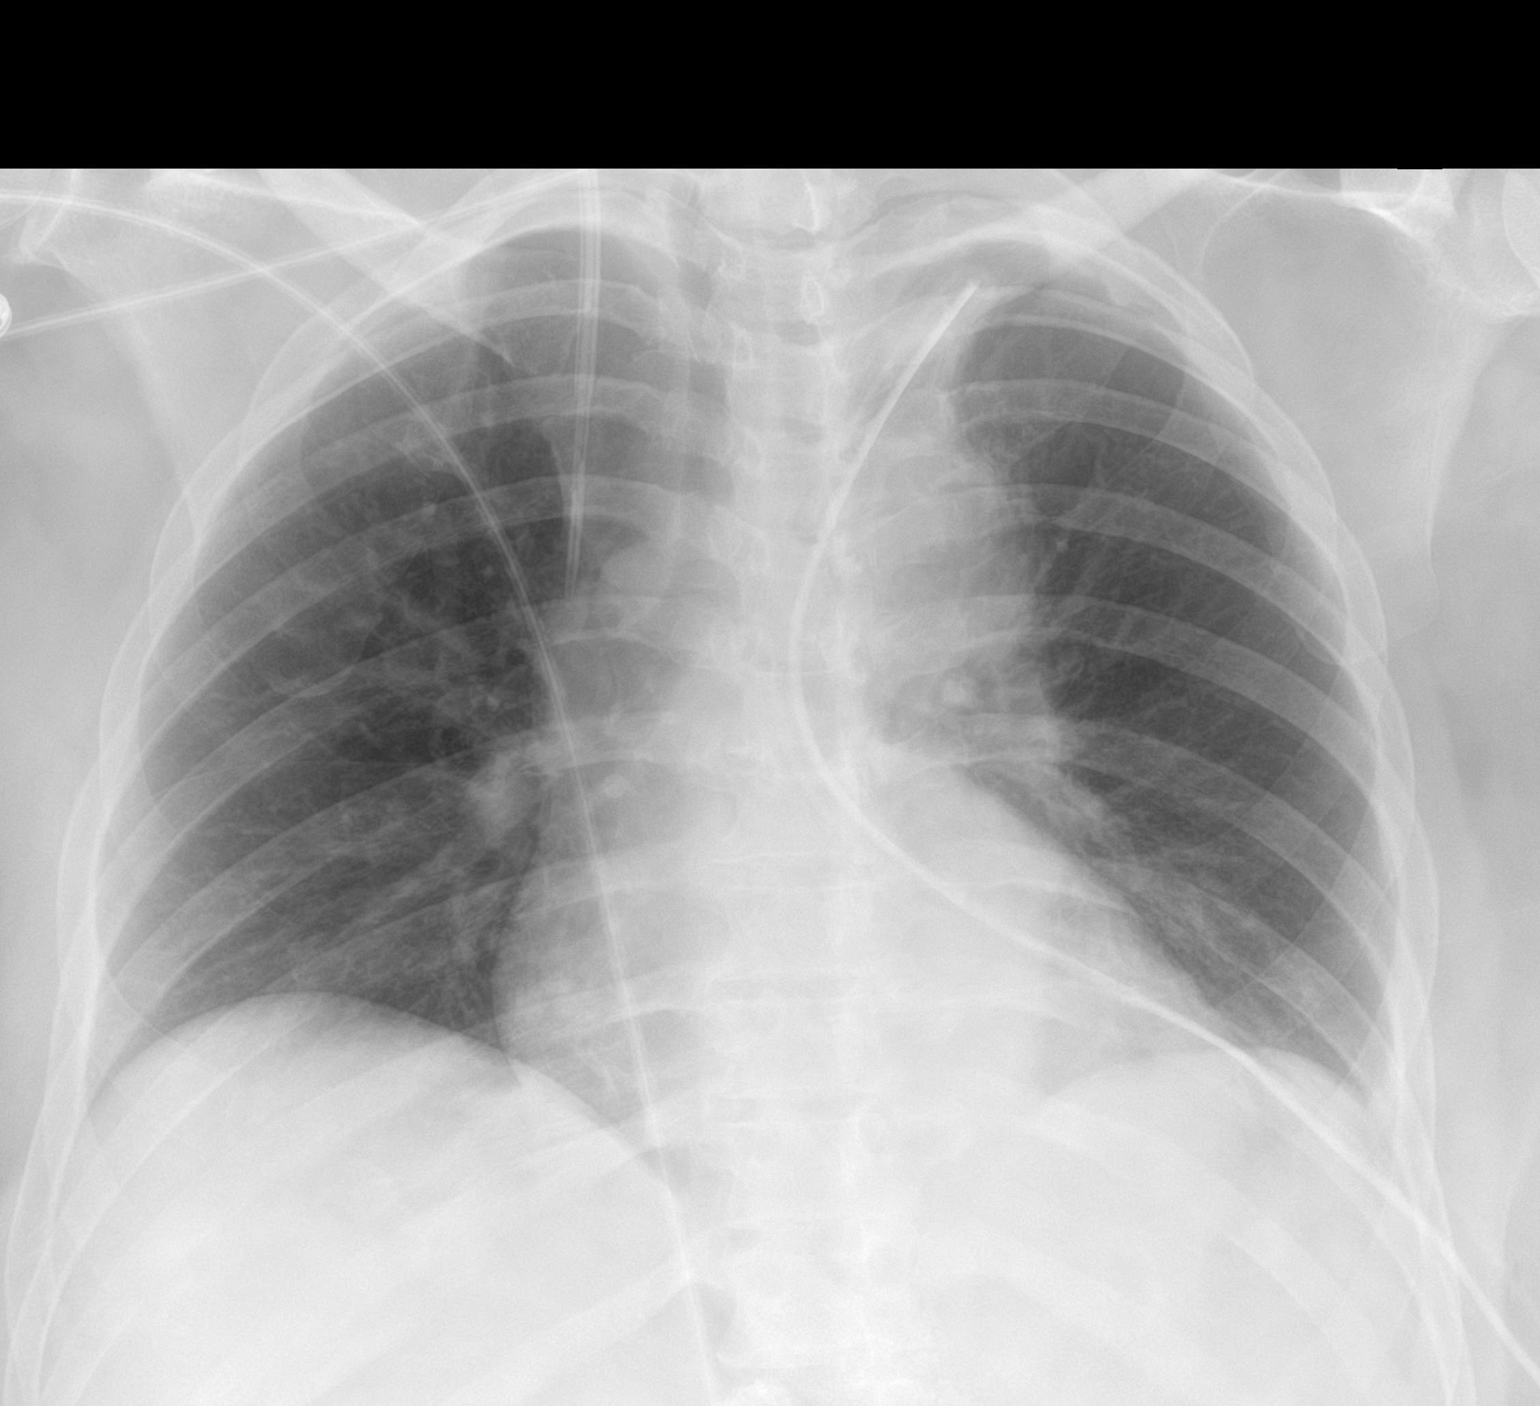

[1 of 1 positions shown; findings below may reference images not displayed]

FINDINGS: RIGHT jugular line with tip projecting over SVC.

LEFT thoracostomy tube present.

LEFT suprahilar density may either represent atelectasis or known
mass as seen on CT.

Remaining lungs clear.

No pleural effusion or pneumothorax.

Osseous structures unremarkable.
IMPRESSION: LEFT suprahilar opacity question atelectasis versus known mass.

No additional acute abnormalities.

## 2023-10-30 IMAGING — DX DG CHEST 1V SAME DAY
1 series · 1 of 1 positions shown · non-contrast
Comparison: Previous studies including the examination done earlier
today

CLINICAL DATA: Removal of left chest tube

EXAM:
CHEST - 1 VIEW SAME DAY

[chest ap]
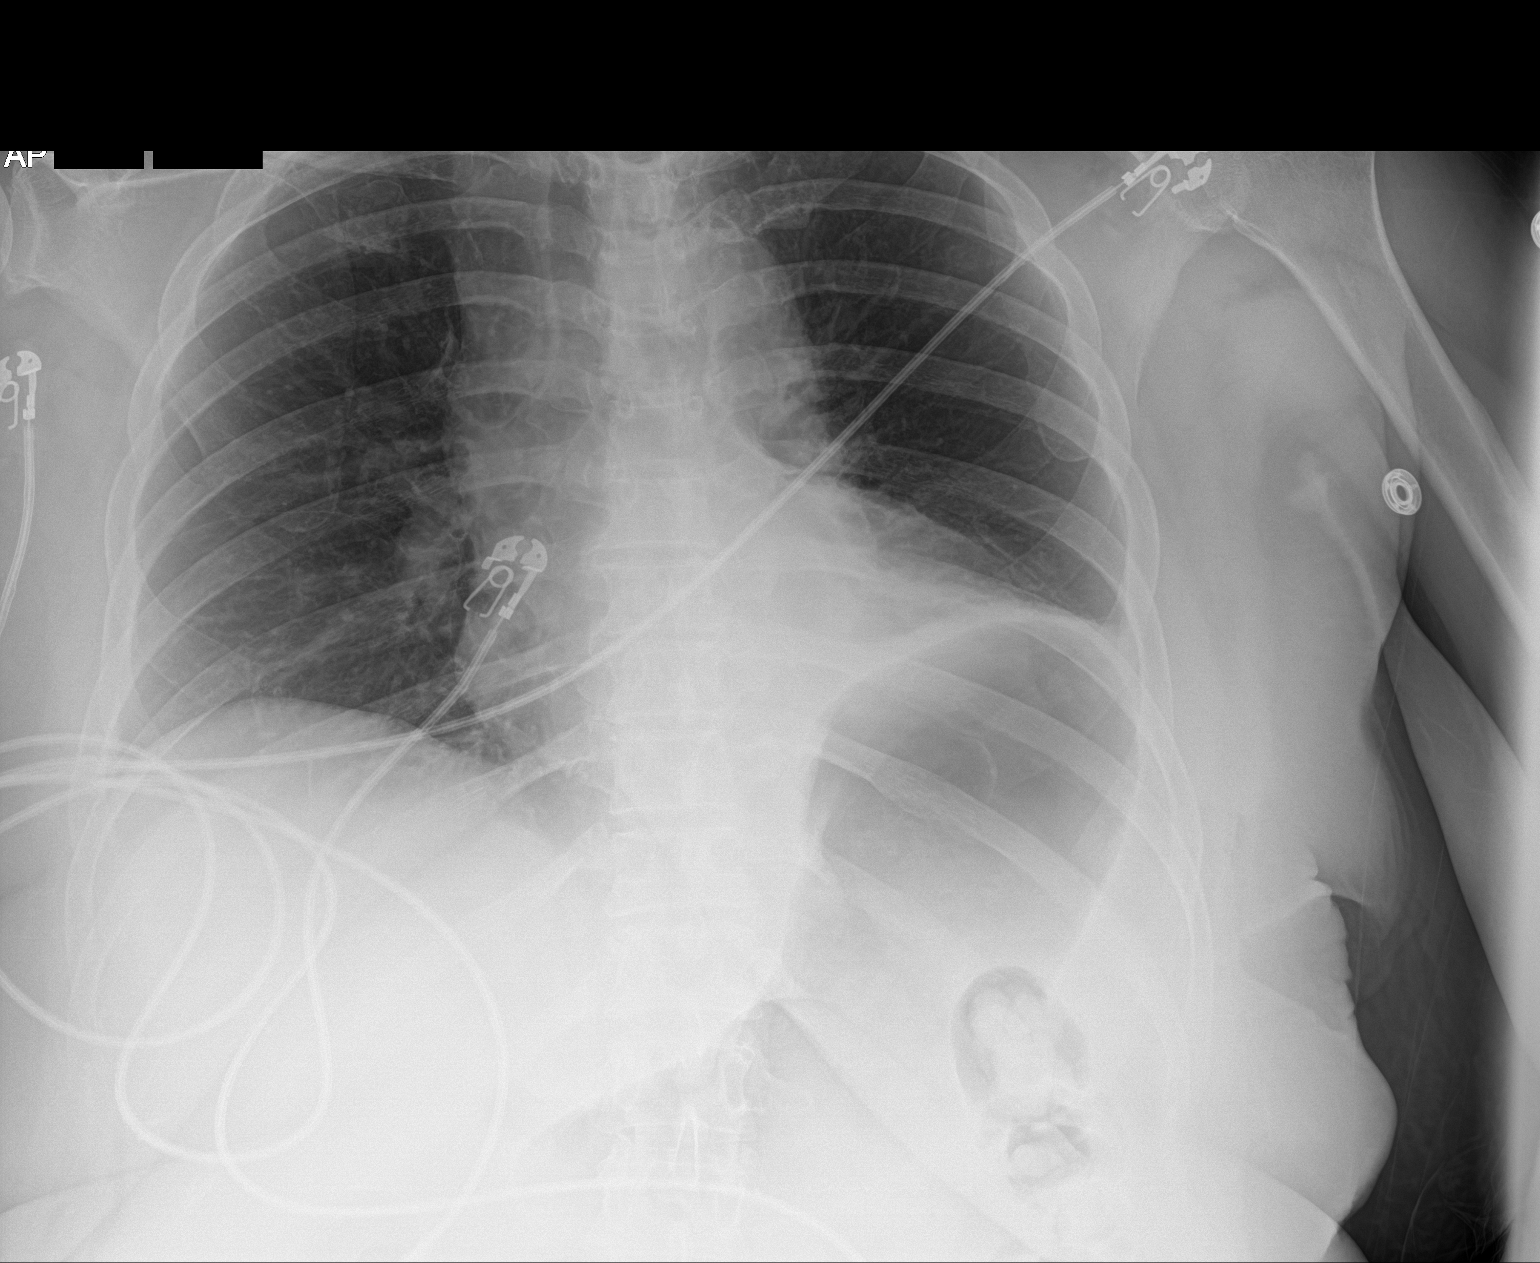

[1 of 1 positions shown; findings below may reference images not displayed]

FINDINGS: There is interval removal of left chest tube. There is no
pneumothorax. There small linear densities in left lower lung fields
suggesting subsegmental atelectasis. No new focal infiltrates are
seen. There are no signs of pulmonary edema. There is minimal
blunting of left lateral CP angle. There is interval removal of
large caliber right jugular central venous catheter. Left
hemidiaphragm is elevated. There is gaseous distention of stomach.
IMPRESSION: Status post removal of left chest tube and right central venous
catheter. There is no pneumothorax. Subsegmental atelectasis is seen
in the left lower lung fields. There is elevation of left
hemidiaphragm due to gaseous distention of stomach.

## 2023-10-30 IMAGING — DX DG CHEST 1V PORT
1 series · 1 of 1 positions shown · non-contrast
Comparison: Chest x-ray 08/16/2021

CLINICAL DATA: Chest tube

EXAM:
PORTABLE CHEST 1 VIEW

[chest ap]
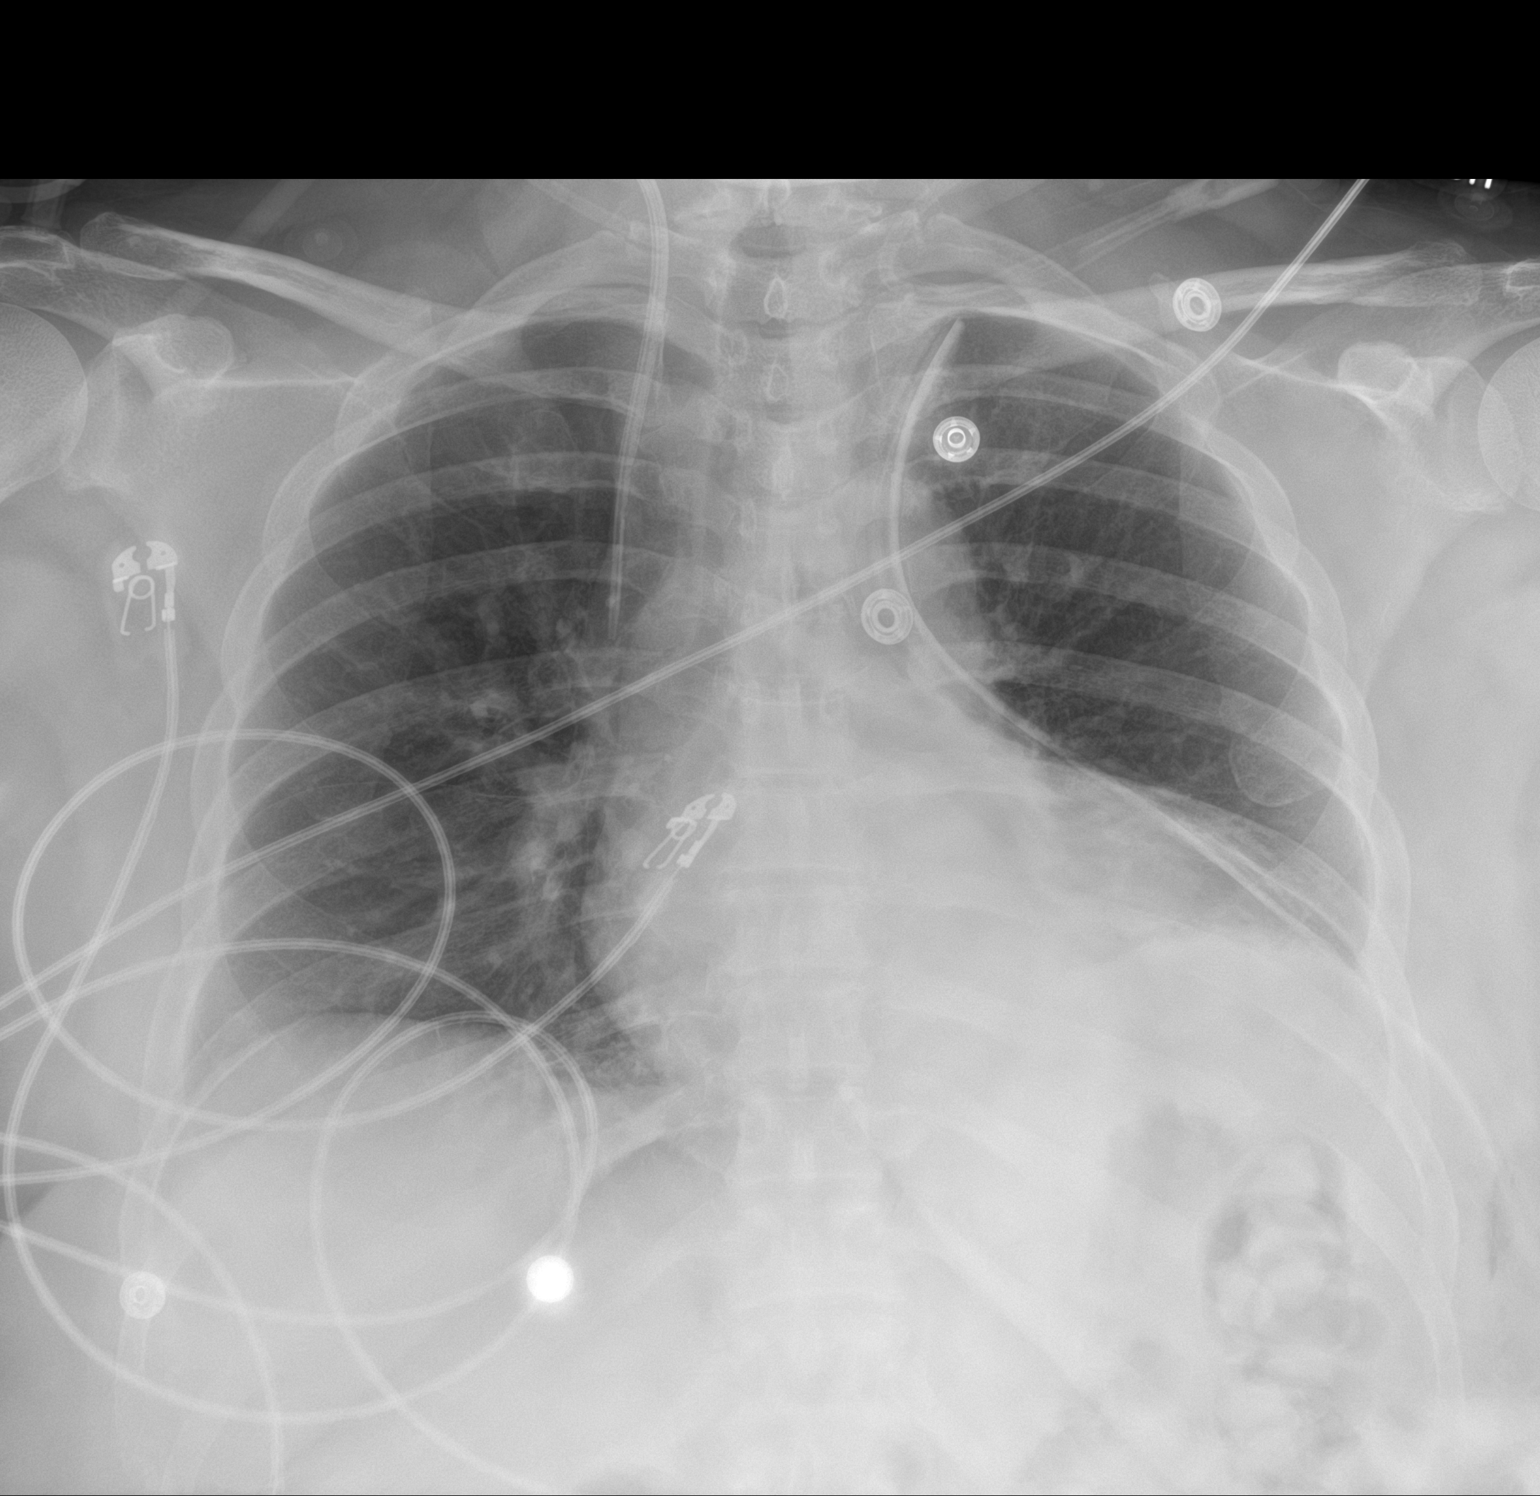

[1 of 1 positions shown; findings below may reference images not displayed]

FINDINGS: Right internal jugular line is stable with the tip in the region of
the SVC. Left chest tube stable with the tip near the apex. Heart
size is normal. Mediastinum appears stable. Likely subsegmental
atelectasis at the left lung base. No significant pleural effusion
visualized. No pneumothorax.
IMPRESSION: 1. Medical devices as described.
2. Likely subsegmental atelectasis at the left lung base.

## 2023-10-31 IMAGING — CR DG CHEST 2V
2 series · 2 of 2 positions shown · non-contrast
Comparison: 08/17/2021

CLINICAL DATA: Status post lymph node biopsy on 08/16/2021.
Follow-up exam.

EXAM:
CHEST - 2 VIEW

[chest lat]
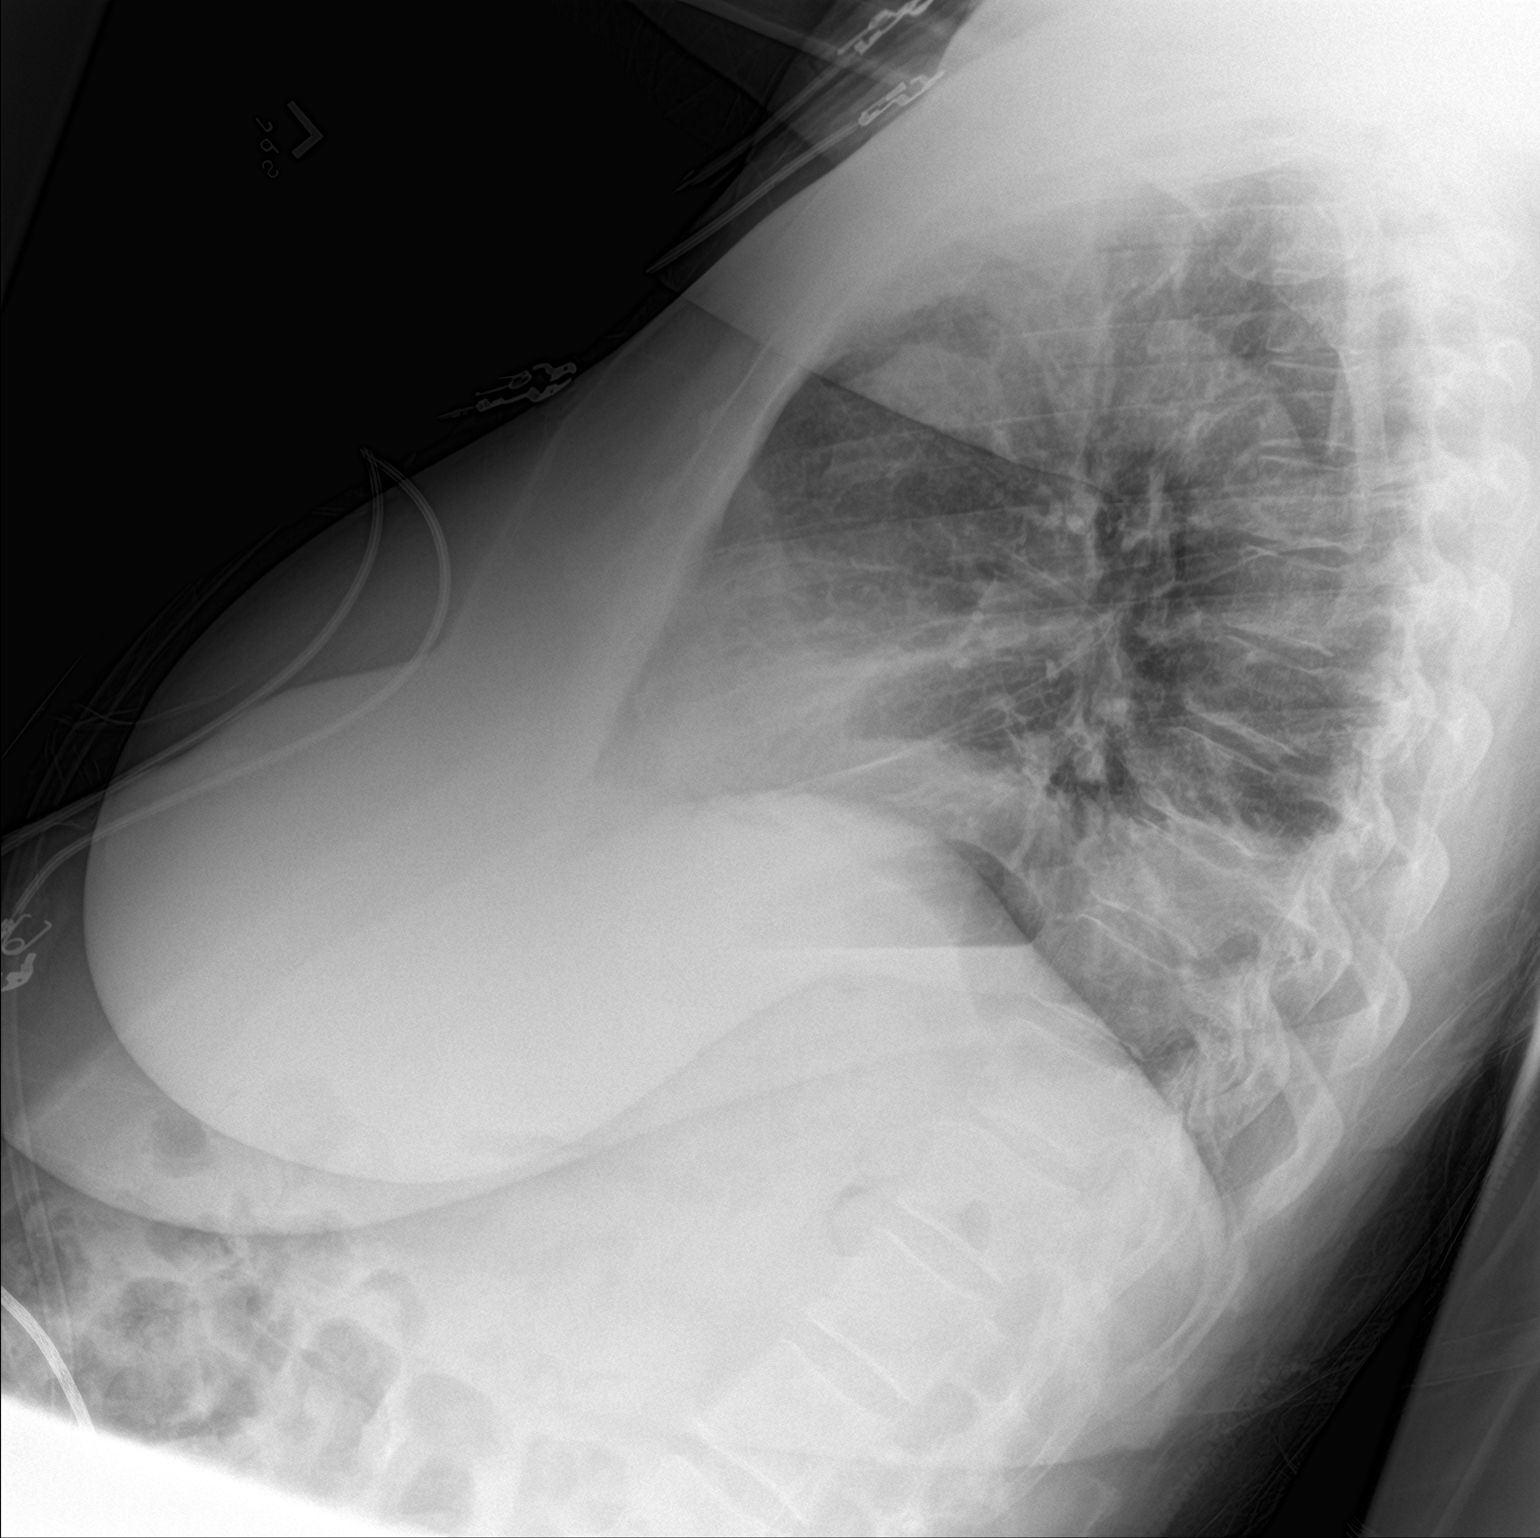

[chest ap]
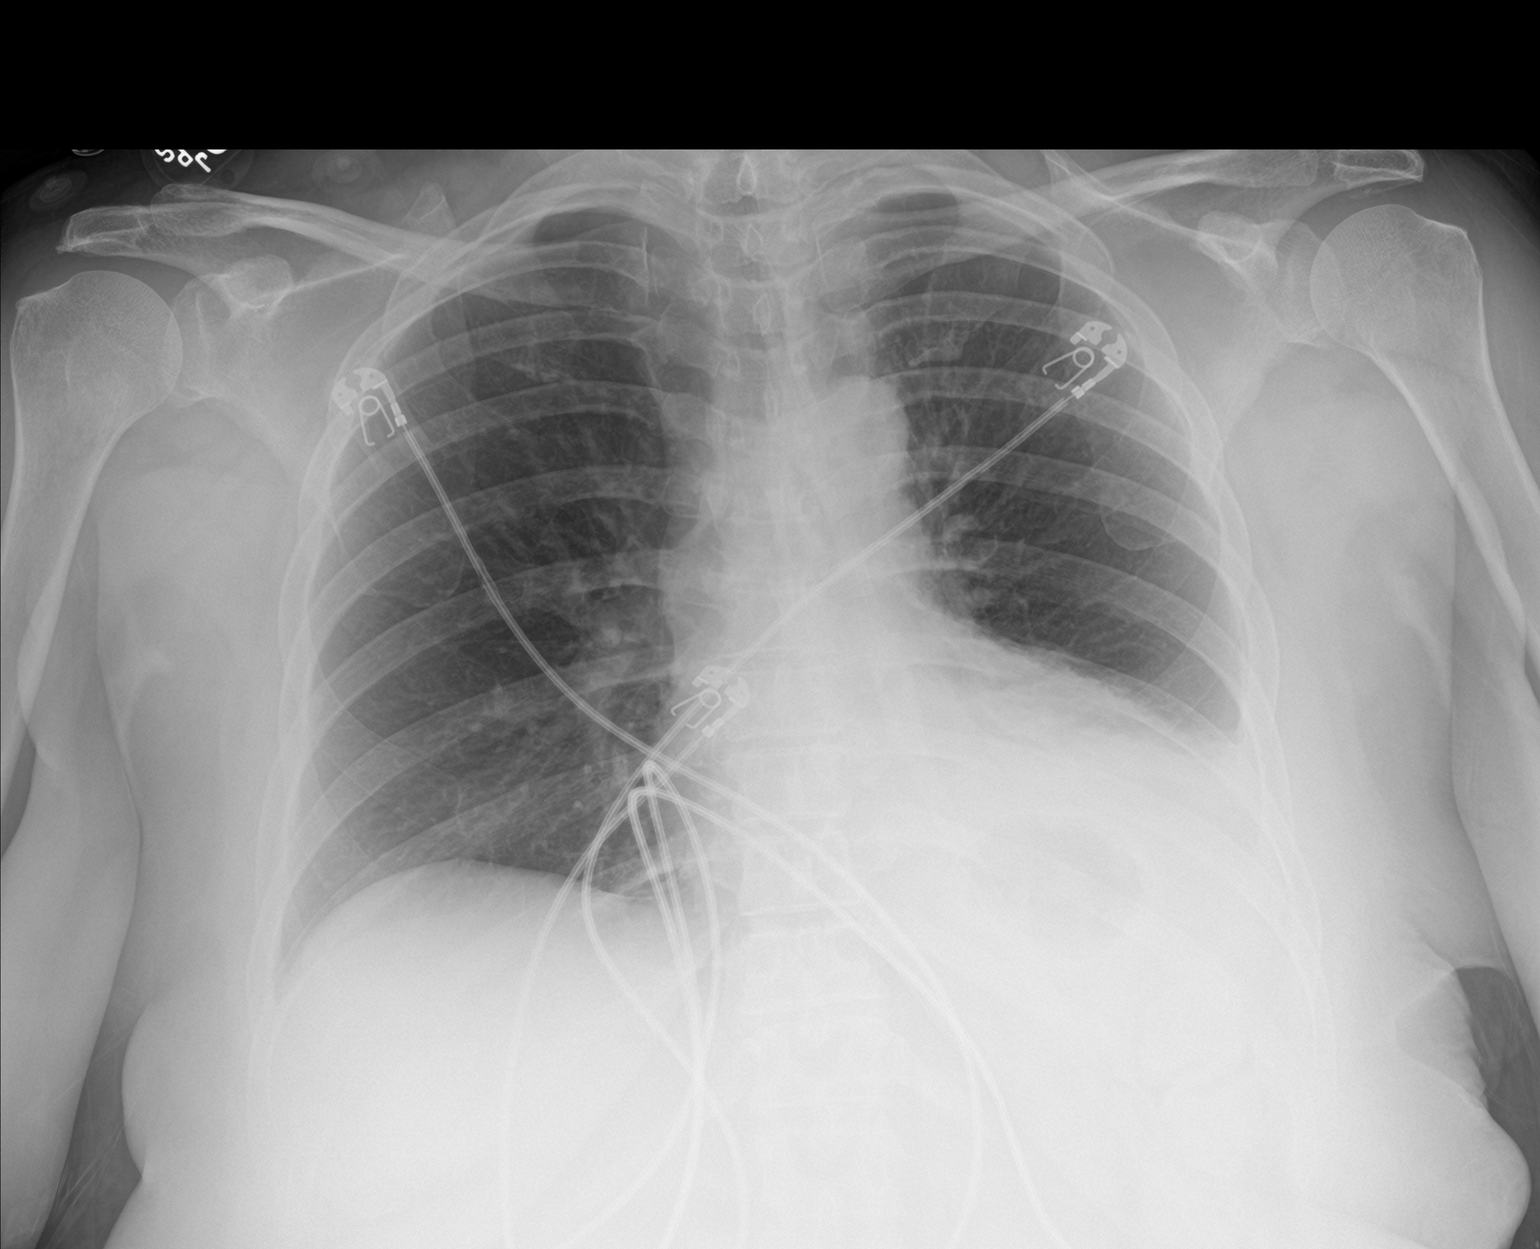

[2 of 2 positions shown; findings below may reference images not displayed]

FINDINGS: Opacity at the left lung base now silhouettes the hemidiaphragm,
consistent with atelectasis with infection possible. Remainder of
the lungs is clear.

No pneumothorax.

Cardiac silhouette is normal in size. Normal mediastinal and hilar
contours.
IMPRESSION: 1. Interval increase in left lung base opacity, most likely due to
increased atelectasis. Consider pneumonia in the proper clinical
setting. No other change.
2. No mediastinal widening.  No pneumothorax.
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
## 2023-11-01 ENCOUNTER — Ambulatory Visit: Payer: BC Managed Care – PPO | Admitting: Physical Therapy

## 2023-11-01 ENCOUNTER — Encounter: Payer: Self-pay | Admitting: Physical Therapy

## 2023-11-01 DIAGNOSIS — M5459 Other low back pain: Secondary | ICD-10-CM | POA: Diagnosis not present

## 2023-11-01 DIAGNOSIS — R293 Abnormal posture: Secondary | ICD-10-CM

## 2023-11-01 DIAGNOSIS — M6281 Muscle weakness (generalized): Secondary | ICD-10-CM | POA: Diagnosis not present

## 2023-11-01 DIAGNOSIS — F411 Generalized anxiety disorder: Secondary | ICD-10-CM | POA: Diagnosis not present

## 2023-11-01 DIAGNOSIS — M62838 Other muscle spasm: Secondary | ICD-10-CM | POA: Diagnosis not present

## 2023-11-01 NOTE — Patient Instructions (Signed)

## 2023-11-03 ENCOUNTER — Encounter: Payer: Self-pay | Admitting: Physical Therapy

## 2023-11-03 ENCOUNTER — Ambulatory Visit: Payer: BC Managed Care – PPO | Admitting: Physical Therapy

## 2023-11-03 DIAGNOSIS — R293 Abnormal posture: Secondary | ICD-10-CM

## 2023-11-03 DIAGNOSIS — M5459 Other low back pain: Secondary | ICD-10-CM | POA: Diagnosis not present

## 2023-11-03 DIAGNOSIS — M62838 Other muscle spasm: Secondary | ICD-10-CM | POA: Diagnosis not present

## 2023-11-03 DIAGNOSIS — M6281 Muscle weakness (generalized): Secondary | ICD-10-CM | POA: Diagnosis not present
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
## 2023-11-06 ENCOUNTER — Ambulatory Visit: Payer: BC Managed Care – PPO | Admitting: Physical Therapy

## 2023-11-06 ENCOUNTER — Encounter: Payer: Self-pay | Admitting: Physical Therapy

## 2023-11-06 DIAGNOSIS — R293 Abnormal posture: Secondary | ICD-10-CM | POA: Diagnosis not present

## 2023-11-06 DIAGNOSIS — M62838 Other muscle spasm: Secondary | ICD-10-CM

## 2023-11-06 DIAGNOSIS — M6281 Muscle weakness (generalized): Secondary | ICD-10-CM

## 2023-11-06 DIAGNOSIS — M5459 Other low back pain: Secondary | ICD-10-CM | POA: Diagnosis not present

## 2023-11-07 ENCOUNTER — Encounter: Payer: Self-pay | Admitting: Dermatology

## 2023-11-08 ENCOUNTER — Ambulatory Visit: Payer: BC Managed Care – PPO | Admitting: Dermatology

## 2023-11-08 ENCOUNTER — Ambulatory Visit: Payer: BC Managed Care – PPO | Admitting: Physical Therapy

## 2023-11-08 DIAGNOSIS — F411 Generalized anxiety disorder: Secondary | ICD-10-CM | POA: Diagnosis not present
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
## 2023-11-13 ENCOUNTER — Encounter: Payer: Self-pay | Admitting: Physical Therapy

## 2023-11-13 ENCOUNTER — Ambulatory Visit: Payer: BC Managed Care – PPO | Admitting: Physical Therapy

## 2023-11-13 DIAGNOSIS — M62838 Other muscle spasm: Secondary | ICD-10-CM | POA: Diagnosis not present

## 2023-11-13 DIAGNOSIS — M5459 Other low back pain: Secondary | ICD-10-CM | POA: Diagnosis not present

## 2023-11-13 DIAGNOSIS — R293 Abnormal posture: Secondary | ICD-10-CM | POA: Diagnosis not present

## 2023-11-13 DIAGNOSIS — M6281 Muscle weakness (generalized): Secondary | ICD-10-CM | POA: Diagnosis not present

## 2023-11-15 ENCOUNTER — Encounter: Payer: Self-pay | Admitting: Physical Therapy

## 2023-11-15 ENCOUNTER — Ambulatory Visit: Payer: BC Managed Care – PPO | Admitting: Physical Therapy

## 2023-11-15 DIAGNOSIS — M62838 Other muscle spasm: Secondary | ICD-10-CM | POA: Diagnosis not present

## 2023-11-15 DIAGNOSIS — M6281 Muscle weakness (generalized): Secondary | ICD-10-CM | POA: Diagnosis not present

## 2023-11-15 DIAGNOSIS — M5459 Other low back pain: Secondary | ICD-10-CM

## 2023-11-15 DIAGNOSIS — F411 Generalized anxiety disorder: Secondary | ICD-10-CM | POA: Diagnosis not present

## 2023-11-15 DIAGNOSIS — R293 Abnormal posture: Secondary | ICD-10-CM
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
## 2023-11-20 ENCOUNTER — Ambulatory Visit: Payer: BC Managed Care – PPO | Attending: Internal Medicine

## 2023-11-20 ENCOUNTER — Telehealth: Payer: Self-pay

## 2023-11-20 DIAGNOSIS — M6281 Muscle weakness (generalized): Secondary | ICD-10-CM | POA: Insufficient documentation

## 2023-11-20 DIAGNOSIS — R293 Abnormal posture: Secondary | ICD-10-CM | POA: Insufficient documentation

## 2023-11-20 DIAGNOSIS — M5459 Other low back pain: Secondary | ICD-10-CM | POA: Insufficient documentation

## 2023-11-20 DIAGNOSIS — M62838 Other muscle spasm: Secondary | ICD-10-CM | POA: Insufficient documentation

## 2023-11-20 NOTE — Telephone Encounter (Signed)
PT called pt due to no-show at 9:30 today.  She thought her appt was at 1:00.  Confirmed her appt on 2/5 and made additional appt at 2/7.

## 2023-11-21 ENCOUNTER — Encounter: Payer: Self-pay | Admitting: Dermatology
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
## 2023-11-22 ENCOUNTER — Ambulatory Visit: Payer: BC Managed Care – PPO | Admitting: Physical Therapy

## 2023-11-22 ENCOUNTER — Ambulatory Visit: Payer: BC Managed Care – PPO | Admitting: Dermatology

## 2023-11-22 DIAGNOSIS — M62838 Other muscle spasm: Secondary | ICD-10-CM | POA: Diagnosis not present

## 2023-11-22 DIAGNOSIS — F411 Generalized anxiety disorder: Secondary | ICD-10-CM | POA: Diagnosis not present

## 2023-11-22 DIAGNOSIS — M6281 Muscle weakness (generalized): Secondary | ICD-10-CM

## 2023-11-22 DIAGNOSIS — R293 Abnormal posture: Secondary | ICD-10-CM | POA: Diagnosis not present

## 2023-11-22 DIAGNOSIS — M5459 Other low back pain: Secondary | ICD-10-CM | POA: Diagnosis not present

## 2023-11-24 ENCOUNTER — Encounter: Payer: Self-pay | Admitting: Physical Therapy

## 2023-11-24 ENCOUNTER — Ambulatory Visit: Payer: BC Managed Care – PPO | Admitting: Physical Therapy

## 2023-11-24 DIAGNOSIS — R293 Abnormal posture: Secondary | ICD-10-CM

## 2023-11-24 DIAGNOSIS — M62838 Other muscle spasm: Secondary | ICD-10-CM

## 2023-11-24 DIAGNOSIS — M5459 Other low back pain: Secondary | ICD-10-CM | POA: Diagnosis not present

## 2023-11-24 DIAGNOSIS — M6281 Muscle weakness (generalized): Secondary | ICD-10-CM | POA: Diagnosis not present
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
## 2023-11-29 DIAGNOSIS — F411 Generalized anxiety disorder: Secondary | ICD-10-CM | POA: Diagnosis not present

## 2023-11-30 ENCOUNTER — Telehealth: Payer: Self-pay

## 2023-11-30 NOTE — Telephone Encounter (Signed)
Patient contacted the office stating she feels like "the thing grew back". She is s/p mediastinal mass resection with Dr. Dorris Fetch 07/2021. She was to follow-up in 6 months 10/2022 but patient states she "thought it was gone". She is currently having chest pain- left sided and does subside. She states that she was sitting on the bed with this happened. She states she was diaphoretic as well. She says that it is the same symptoms she was having before her surgery with Dr. Dorris Fetch in 2022. Advised that she should contact Dr. Asa Lente office for follow-up with a CT chest and we would reschedule her appointment at that time. 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 Expressed urgency to be seen medically for chest pain symptoms due to nature of them and advised not to wait for an appointment to rule out cardiac related symptoms. She acknowledged receipt and again states she will follow up when she is back in town.

## 2023-12-01 ENCOUNTER — Other Ambulatory Visit: Payer: BC Managed Care – PPO

## 2023-12-06 ENCOUNTER — Encounter: Payer: Self-pay | Admitting: Physical Therapy

## 2023-12-06 ENCOUNTER — Ambulatory Visit: Payer: BC Managed Care – PPO | Admitting: Physical Therapy

## 2023-12-06 DIAGNOSIS — F411 Generalized anxiety disorder: Secondary | ICD-10-CM | POA: Diagnosis not present

## 2023-12-11 ENCOUNTER — Telehealth: Payer: Self-pay | Admitting: Internal Medicine

## 2023-12-13 DIAGNOSIS — R400 Somnolence: Secondary | ICD-10-CM | POA: Diagnosis not present

## 2023-12-13 DIAGNOSIS — R079 Chest pain, unspecified: Secondary | ICD-10-CM | POA: Diagnosis not present

## 2023-12-13 DIAGNOSIS — F411 Generalized anxiety disorder: Secondary | ICD-10-CM | POA: Diagnosis not present

## 2023-12-16 ENCOUNTER — Emergency Department (HOSPITAL_COMMUNITY)
Admission: EM | Admit: 2023-12-16 | Discharge: 2023-12-16 | Disposition: A | Discharge status: Home / Self Care | Attending: Emergency Medicine | Admitting: Emergency Medicine

## 2023-12-16 ENCOUNTER — Encounter (HOSPITAL_COMMUNITY): Payer: Self-pay | Admitting: Emergency Medicine

## 2023-12-16 ENCOUNTER — Emergency Department (HOSPITAL_COMMUNITY)

## 2023-12-16 ENCOUNTER — Other Ambulatory Visit: Payer: Self-pay

## 2023-12-16 DIAGNOSIS — R0789 Other chest pain: Secondary | ICD-10-CM | POA: Insufficient documentation

## 2023-12-16 DIAGNOSIS — Z7982 Long term (current) use of aspirin: Secondary | ICD-10-CM | POA: Diagnosis not present

## 2023-12-16 DIAGNOSIS — E860 Dehydration: Secondary | ICD-10-CM | POA: Insufficient documentation

## 2023-12-16 DIAGNOSIS — J9811 Atelectasis: Secondary | ICD-10-CM | POA: Diagnosis not present

## 2023-12-16 DIAGNOSIS — Z79899 Other long term (current) drug therapy: Secondary | ICD-10-CM | POA: Diagnosis not present

## 2023-12-16 DIAGNOSIS — E876 Hypokalemia: Secondary | ICD-10-CM | POA: Insufficient documentation

## 2023-12-16 DIAGNOSIS — I10 Essential (primary) hypertension: Secondary | ICD-10-CM | POA: Diagnosis not present

## 2023-12-16 DIAGNOSIS — R2681 Unsteadiness on feet: Secondary | ICD-10-CM | POA: Insufficient documentation

## 2023-12-16 DIAGNOSIS — R079 Chest pain, unspecified: Secondary | ICD-10-CM | POA: Diagnosis not present

## 2023-12-16 LAB — CBC
HCT: 38.4 % (ref 36.0–46.0)
Hemoglobin: 12.2 g/dL (ref 12.0–15.0)
MCH: 30 pg (ref 26.0–34.0)
MCHC: 31.8 g/dL (ref 30.0–36.0)
MCV: 94.3 fL (ref 80.0–100.0)
Platelets: 417 10*3/uL — ABNORMAL HIGH (ref 150–400)
RBC: 4.07 MIL/uL (ref 3.87–5.11)
RDW: 14.4 % (ref 11.5–15.5)
WBC: 4.8 10*3/uL (ref 4.0–10.5)
nRBC: 0 % (ref 0.0–0.2)

## 2023-12-16 LAB — HEPATIC FUNCTION PANEL
ALT: 25 U/L (ref 0–44)
AST: 17 U/L (ref 15–41)
Albumin: 3.9 g/dL (ref 3.5–5.0)
Alkaline Phosphatase: 57 U/L (ref 38–126)
Bilirubin, Direct: <0.1 mg/dL (ref 0.0–0.2)
Total Bilirubin: 0.5 mg/dL (ref 0.0–1.2)
Total Protein: 7.2 g/dL (ref 6.5–8.1)

## 2023-12-16 LAB — BASIC METABOLIC PANEL
Anion gap: 8 (ref 5–15)
BUN: 20 mg/dL (ref 8–23)
CO2: 28 mmol/L (ref 22–32)
Calcium: 9.3 mg/dL (ref 8.9–10.3)
Chloride: 104 mmol/L (ref 98–111)
Creatinine, Ser: 1.29 mg/dL — ABNORMAL HIGH (ref 0.44–1.00)
GFR, Estimated: 47 mL/min — ABNORMAL LOW (ref >60)
Glucose, Bld: 101 mg/dL — ABNORMAL HIGH (ref 70–99)
Potassium: 3.2 mmol/L — ABNORMAL LOW (ref 3.5–5.1)
Sodium: 140 mmol/L (ref 135–145)

## 2023-12-16 LAB — RESP PANEL BY RT-PCR (RSV, FLU A&B, COVID)  RVPGX2
Influenza A by PCR: NEGATIVE
Influenza B by PCR: NEGATIVE
Resp Syncytial Virus by PCR: NEGATIVE
SARS Coronavirus 2 by RT PCR: NEGATIVE

## 2023-12-16 LAB — TROPONIN I (HIGH SENSITIVITY)
Troponin I (High Sensitivity): 2 ng/L (ref <18)
Troponin I (High Sensitivity): 2 ng/L (ref <18)

## 2023-12-16 LAB — D-DIMER, QUANTITATIVE: D-Dimer, Quant: 0.3 ug{FEU}/mL (ref 0.00–0.50)

## 2023-12-16 MED ORDER — SODIUM CHLORIDE 0.9 % IV BOLUS
500.0000 mL | Freq: Once | INTRAVENOUS | Status: AC
  Administered 2023-12-16: 500 mL via INTRAVENOUS

## 2023-12-16 NOTE — Discharge Instructions (Signed)
 You are seen in the emergency department today for concerns of chest pain.  Thankfully your labs and imaging were all reassuring with a normal EKG and your troponin levels which is the enzyme which tracks heart damage was negative and the repeat level was also negative.  Your D-dimer level checking for any signs of a blood clot in the lungs was also negative.  Thankfully with this workup, I feel comfortable discharging you home with plans for outpatient follow-up.  I did put in a referral for you to be seen by cardiology and you should expect to be contacted by them to schedule a visit.  For any concerns of new or worsening symptoms, return to the emergency department.

## 2023-12-16 NOTE — ED Provider Notes (Signed)
  EMERGENCY DEPARTMENT AT [facility] Provider Note   CSN: [id number] Arrival date & time: 12/16/23  1437     History Chief Complaint  Patient presents with   Chest Pain    Taylor Chen is a 63 y.o. female.  Patient with past history significant for hypertension, heart murmur, anginal pain, thymoma presents ED with concerns of chest pain.  Endorsing feelings of central chest pain without significant radiating pain towards either extremity.  Endorses some diaphoresis and dizziness at the time of this chest pain.  States that the pain resolves after taking 2 doses of nitroglycerin but did not feel the medication more particularly quickly.  Feels a burning sensation in the central chest.  She reports that she takes 2 baby aspirin but no blood thinners.  States that over the last several days to weeks has had some increasing feelings of unsteadiness with possible dizziness but no feelings of room spinning.  Has also had some mechanical falls from this unsteadiness.  Does report a mild head strike several days ago but no residual headache, nausea, or vomiting. No vision changes.   Chest Pain      Home Medications Prior to Admission medications   Medication Sig Start Date End Date Taking? Authorizing Provider  amLODipine (NORVASC) 5 MG tablet Take 7.5 mg by mouth daily. 06/28/21   [provider]  aspirin EC 81 MG tablet Take 81 mg by mouth daily. Swallow whole.    [provider]  atorvastatin (LIPITOR) 20 MG tablet Take 20 mg by mouth at bedtime.    [provider]  CALCIUM PO Take 1 tablet by mouth daily. Patient not taking: Reported on 10/04/2023    [provider]  cetirizine (ZYRTEC) 10 MG tablet Take 10 mg by mouth daily as needed for allergies.    [provider]  doxepin (SINEQUAN) 75 MG capsule Take 150 mg by mouth at bedtime. 06/23/21   [provider]  HYDROcodone-acetaminophen (NORCO/VICODIN) 5-325 MG tablet  Take 0.5-1 tablets by mouth every 6 (six) hours as needed for severe pain. Patient not taking: Reported on 10/04/2023 03/22/23   Renne Crigler, PA-C  latanoprost (XALATAN) 0.005 % ophthalmic solution Place 1 drop into both eyes at bedtime.    [provider]  Multiple Vitamins-Minerals (ONE-A-DAY 50 PLUS PO) Take 1 tablet by mouth daily. Patient not taking: Reported on 10/04/2023    [provider]  SUMAtriptan (IMITREX) 100 MG tablet Take 100 mg by mouth every 2 (two) hours as needed for migraine. May repeat in 2 hours if headache persists or recurs.    [provider]      Allergies    Hydrochlorothiazide, Lisinopril, Metoprolol, Penicillins, and Percocet [oxycodone-acetaminophen]    Review of Systems   Review of Systems  Cardiovascular:  Positive for chest pain.  All other systems reviewed and are negative.   Physical Exam Updated Vital Signs BP 137/89   Pulse 87   Temp 98.2 F (36.8 C) (Oral)   Resp 16   SpO2 95%  Physical Exam Vitals and nursing note reviewed.  Constitutional:      General: She is not in acute distress.    Appearance: She is well-developed.  HENT:     Head: Normocephalic and atraumatic.  Eyes:     Conjunctiva/sclera: Conjunctivae normal.  Cardiovascular:     Rate and Rhythm: Normal rate and regular rhythm.     Heart sounds: No murmur heard. Pulmonary:     Effort: Pulmonary  effort is normal. No respiratory distress.     Breath sounds: Normal breath sounds. No decreased breath sounds, wheezing or rhonchi.  Abdominal:     Palpations: Abdomen is soft.     Tenderness: There is no abdominal tenderness.  Musculoskeletal:        General: No swelling.     Cervical back: Neck supple.  Skin:    General: Skin is warm and dry.     Capillary Refill: Capillary refill takes less than 2 seconds.  Neurological:     Mental Status: She is alert.  Psychiatric:        Mood and Affect: Mood normal.     ED Results / Procedures /  Treatments   Labs (all labs ordered are listed, but only abnormal results are displayed) Labs Reviewed  BASIC METABOLIC PANEL - Abnormal; Notable for the following components:      Result Value   Potassium 3.2 (*)    Glucose, Bld 101 (*)    Creatinine, Ser 1.29 (*)    GFR, Estimated 47 (*)    All other components within normal limits  CBC - Abnormal; Notable for the following components:   Platelets 417 (*)    All other components within normal limits  RESP PANEL BY RT-PCR (RSV, FLU A&B, COVID)  [id number]  D-DIMER, QUANTITATIVE  HEPATIC FUNCTION PANEL  TROPONIN I (HIGH SENSITIVITY)  TROPONIN I (HIGH SENSITIVITY)    EKG EKG Interpretation Date/Time:  Saturday December 16 2023 14:48:30 EST Ventricular Rate:  100 PR Interval:  182 QRS Duration:  80 QT Interval:  347 QTC Calculation: 448 R Axis:   2  Text Interpretation: Sinus tachycardia Left ventricular hypertrophy Anterior Q waves, possibly due to LVH No acute changes No significant change since last tracing Confirmed by Derwood Kaplan [phone]) on 12/16/2023 3:50:41 PM  Radiology DG Chest 2 View Result Date: 12/16/2023 CLINICAL DATA:  Chest pain. EXAM: CHEST - 2 VIEW COMPARISON:  04/26/2022 FINDINGS: The cardiomediastinal contours are normal. Subsegmental opacity at the periphery of the left lung base. Pulmonary vasculature is normal. No pleural effusion or pneumothorax. No acute osseous abnormalities are seen. IMPRESSION: Subsegmental atelectasis at the left lung base. Electronically Signed   By: Narda Rutherford M.D.   On: 12/16/2023 16:14    Procedures Procedures    Medications Ordered in ED Medications  sodium chloride 0.9 % bolus 500 mL (500 mLs Intravenous New Bag/Given 12/16/23 1704)    ED Course/ Medical Decision Making/ A&P                                 Medical Decision Making Amount and/or Complexity of Data Reviewed Labs: ordered. Radiology: ordered.   This patient presents to the ED for concern of chest  pain.  Differential diagnosis includes atypical chest pain, PE, pneumonia, acid reflux   Lab Tests:  I Ordered, and personally interpreted labs.  The pertinent results include: CBC unremarkable, BMP with hypokalemia 3.2, slight decline in kidney function with creatinine up to 1.29, GFR down to 47, D-dimer negative at 0.30, respiratory panel negative, hepatic function panel normal, troponin at 2 with repeat level at 2   Imaging Studies ordered:  I ordered imaging studies including chest x-ray I independently visualized and interpreted imaging which showed no acute cardiopulmonary findings, subsegmental atelectasis at the left lung base I agree with the radiologist interpretation   Medicines ordered and prescription drug management:  I ordered medication  including fluids for dehydration Reevaluation of the patient after these medicines showed that the patient improved I have reviewed the patients home medicines and have made adjustments as needed   Problem List / ED Course:  Patient presents to the emergency department today with concerns of chest pain.  She reports that she was sitting down outside when she began to have this sensation of central chest pain with external radiation but no specific extremity.  Endorse some nausea but denies any vomiting.  No diaphoresis or syncope.  Reports that she took 2 doses of nitroglycerin at home with eventual improvement in symptoms.  At this time, patient is asymptomatic. Physical exam reassuring.  No abnormal lung sounds, lungs are clear to auscultation, no appreciable leg swelling.  Pupils are PERRL.  Will proceed with cardiac workup to rule out/rule in specific etiology. Workup reassuring.  Troponin level flat at 2, D-dimer negative at 0.30.  Doubt ACS, PE, or other specific cardiac cause. Informed patient of reassuring findings.  Has not no recurrence in her chest pain symptoms.  Given patient's story is somewhat abnormal, her heart score is a 4  or 5 placing her at a medium risk.  Discussed possible hospitalization for further cardiac workup.  Patient will prefer to be discharged home with outpatient follow-up with cardiology.  Cardiology referral placed.  Discussed return precautions such as recurrence of her symptoms or new or worsening symptoms.  Patient otherwise stable at this time and discharged home.  Final Clinical Impression(s) / ED Diagnoses Final diagnoses:  Atypical chest pain  Hypokalemia  Dehydration    Rx / DC Orders ED Discharge Orders          Ordered    Ambulatory referral to Cardiology       Comments: If you have not heard from the Cardiology office within the next 72 hours please call [phone].   12/16/23 1854              Smitty Knudsen, PA-C 12/16/23 Toma Aran, MD 12/16/23 1910

## 2023-12-16 NOTE — ED Triage Notes (Signed)
 Patient presents due to chest pain, diaphoresis and dizziness. Patient has taken 2 nitroglycerin. Post nitroglycerin, the chest has reduced to a slight burning.

## 2023-12-20 ENCOUNTER — Ambulatory Visit

## 2023-12-20 ENCOUNTER — Ambulatory Visit: Payer: BC Managed Care – PPO | Admitting: Physical Therapy

## 2023-12-20 DIAGNOSIS — F411 Generalized anxiety disorder: Secondary | ICD-10-CM | POA: Diagnosis not present
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
## 2023-12-27 ENCOUNTER — Other Ambulatory Visit: Payer: BC Managed Care – PPO

## 2023-12-27 ENCOUNTER — Ambulatory Visit: Payer: BC Managed Care – PPO | Admitting: Internal Medicine

## 2023-12-27 DIAGNOSIS — F411 Generalized anxiety disorder: Secondary | ICD-10-CM | POA: Diagnosis not present

## 2023-12-27 NOTE — Progress Notes (Unsigned)
 Cardiology Note    Date:  12/28/2023   ID:  Taylor Chen, DOB [birth year], MRN [id number]  PCP:  Harvest Forest, MD  Cardiologist:  Armanda Magic, MD   Chief Complaint  Patient presents with   Chest Pain     History of Present Illness:  Taylor Chen is a 63 y.o. female with a hx of anemia, asthma, depression, migraine HAs, heart murmur in the past, HTN and DM.  She was seen by me in 2022 for chest pain workup when she presented to the ER with complaints of anterior chest pain that was a burning sensation and was dx with an anterior mediastinal mass and thyroid nodule.  She subsequently underwent thoracoscopy and resection with dx of thymoma.  I have not seen her in 3 years.  She is now referred back for chest pain.  She has bee getting a very sharp pain in the midsternal area.  It was worse laying on her stomach to sleep.  It happened once early this week and then 2 days ago. The first episode she went to the ER for and EKG showed HR 100 and no acute ST changes. hsTrop was normal and she does not recall that it came on with any reaching.    The second episode She went to reach for something and had sudden onset of sharp pain.  She took NTG which did not help.  It was worse with taking a deep breath in or with any movement.  There was no radiation of the pain but broke out in a sweat and the room started to spin and fell down but no syncope.  The pain went away after laying down.  It is now sore to touch over her chest wall.  She denies any SOB, LE edema, palpitations or syncope.   Past Medical History:  Diagnosis Date   Anemia    IN HER 20'S   Anginal pain (HCC)    LAST COUPLE DAYS   Anxiety    Asthma    CHILDHOOD   Depression    Headache    MIGRAINES   Heart murmur    HAS BEEN TOLD   History of stress test 2012   Hypertension    CONTROLLED BY DIET   Pre-diabetes     Past Surgical History:  Procedure Laterality Date   DILATATION & CURETTAGE/HYSTEROSCOPY WITH MYOSURE  N/A 08/25/2016   Procedure: DILATATION & CURETTAGE/HYSTEROSCOPY WITH MYOSURE;  Surgeon: Mitchel Honour, DO;  Location: [facility];  Service: Gynecology;  Laterality: N/A;   HYSTOSCOPY  2010   INCISION AND DRAINAGE  1999   LYMPH NODE BIOPSY  08/16/2021   Procedure: LYMPH NODE BIOPSY;  Surgeon: Loreli Slot, MD;  Location: [facility];  Service: Thoracic;;    Current Medications: Current Meds  Medication Sig   amLODipine (NORVASC) 5 MG tablet Take 7.5 mg by mouth daily.   aspirin EC 81 MG tablet Take 81 mg by mouth daily. Swallow whole.   atorvastatin (LIPITOR) 20 MG tablet Take 20 mg by mouth at bedtime.   Azelastine HCl 137 MCG/SPRAY SOLN Place into both nostrils as needed (allergy).   cetirizine (ZYRTEC) 10 MG tablet Take 10 mg by mouth daily as needed for allergies.   doxepin (SINEQUAN) 75 MG capsule Take 150 mg by mouth at bedtime.   FLUoxetine (PROZAC) 10 MG capsule Take 10 mg by mouth daily.   latanoprost (XALATAN) 0.005 % ophthalmic solution Place 1 drop into both eyes at bedtime.  Multiple Vitamins-Minerals (ONE-A-DAY 50 PLUS PO) Take 1 tablet by mouth daily.   nitroGLYCERIN (NITROSTAT) 0.4 MG SL tablet Place under the tongue as needed for chest pain.   rosuvastatin (CRESTOR) 40 MG tablet 40 mg daily.   SUMAtriptan (IMITREX) 100 MG tablet Take 100 mg by mouth every 2 (two) hours as needed for migraine. May repeat in 2 hours if headache persists or recurs.   WEGOVY 0.5 MG/0.5ML SOAJ once a week.    Allergies:   Hydrochlorothiazide, Lisinopril, Metoprolol, Penicillins, and Percocet [oxycodone-acetaminophen]   Social History   Socioeconomic History   Marital status: Single    Spouse name: Not on file   Number of children: Not on file   Years of education: Not on file   Highest education level: Not on file  Occupational History   Not on file  Tobacco Use   Smoking status: Never   Smokeless tobacco: Never  Vaping Use   Vaping status: Never Used  Substance and Sexual Activity    Alcohol use: No   Drug use: No   Sexual activity: Not Currently  Other Topics Concern   Not on file  Social History Narrative   Not on file   Social Drivers of Health   Financial Resource Strain: Not on file  Food Insecurity: Not on file  Transportation Needs: Not on file  Physical Activity: Not on file  Stress: Not on file  Social Connections: Not on file     Family History:  The patient's family history includes Breast cancer (age of onset: 49) in her mother.   ROS:   Please see the history of present illness.    ROS All other systems reviewed and are negative.      No data to display           PHYSICAL EXAM:   VS:  BP 100/70 (BP Location: Right Arm)   Pulse 92   Ht 5\' 1"  (1.549 m)   Wt 171 lb 6.4 oz (77.7 kg)   SpO2 96%   BMI 32.39 kg/m    GEN: Well nourished, well developed in no acute distress HEENT: Normal NECK: No JVD; No carotid bruits LYMPHATICS: No lymphadenopathy CARDIAC:RRR, no murmurs, rubs, gallops RESPIRATORY:  Clear to auscultation without rales, wheezing or rhonchi  ABDOMEN: Soft, non-tender, non-distended MUSCULOSKELETAL:  No edema; No deformity  SKIN: Warm and dry NEUROLOGIC:  Alert and oriented x 3 PSYCHIATRIC:  Normal affect  Wt Readings from Last 3 Encounters:  12/28/23 171 lb 6.4 oz (77.7 kg)  06/23/23 178 lb (80.7 kg)  04/26/22 172 lb (78 kg)      Studies/Labs Reviewed:    Recent Labs: 12/16/2023: ALT 25; BUN 20; Creatinine, Ser 1.29; Hemoglobin 12.2; Platelets 417; Potassium 3.2; Sodium 140   Lipid Panel No results found for: "CHOL", "TRIG", "HDL", "CHOLHDL", "VLDL", "LDLCALC", "LDLDIRECT"   Additional studies/ records that were reviewed today include:  Hospital notes    ASSESSMENT:    1. Chest pain of uncertain etiology       PLAN:  In order of problems listed above:  Chest pain -Initial presentation in 2022 that was atypical and was later dx with thymoma which was resected and CP resolved -Now back with  recurrent chest pain that is very atypical and suspect it it MSK. EKG in ER was nonischemic and hs Trop negative. -she has never smoked, no fm hx of CAD and Chest CT  03/2023 with no coronary artery calcifications -She is very concerned about the  pain so I have recommended getting a coronary CTA to assess for CAD and define coronary anatomy -check 2D echo   Time Spent: 20 minutes total time of encounter, including 15 minutes spent in face-to-face patient care on the date of this encounter. This time includes coordination of care and counseling regarding above mentioned problem list. Remainder of non-face-to-face time involved reviewing chart documents/testing relevant to the patient encounter and documentation in the medical record. I have independently reviewed documentation from referring provider  Medication Adjustments/Labs and Tests Ordered: Current medicines are reviewed at length with the patient today.  Concerns regarding medicines are outlined above.  Medication changes, Labs and Tests ordered today are listed in the Patient Instructions below.     Signed, Armanda Magic, MD  12/28/2023 8:21 AM    [facility] [address] [place], [place], [place]  [address] Phone: [phone]; Fax: [phone]

## 2023-12-28 ENCOUNTER — Ambulatory Visit: Attending: Cardiology | Admitting: Cardiology

## 2023-12-28 VITALS — BP 100/70 | HR 92 | Ht 61.0 in | Wt 171.4 lb

## 2023-12-28 DIAGNOSIS — R079 Chest pain, unspecified: Secondary | ICD-10-CM | POA: Diagnosis not present

## 2023-12-28 MED ORDER — IVABRADINE HCL 7.5 MG PO TABS: ORAL_TABLET | ORAL | 0 refills | Status: AC

## 2023-12-28 NOTE — Patient Instructions (Addendum)
 Medication Instructions:  Your physician recommends that you continue on your current medications as directed. Please refer to the Current Medication list given to you today.  *If you need a refill on your cardiac medications before your next appointment, please call your pharmacy*   Lab Work: None ordered  If you have labs (blood work) drawn today and your tests are completely normal, you will receive your results only by: MyChart Message (if you have MyChart) OR A paper copy in the mail If you have any lab test that is abnormal or we need to change your treatment, we will call you to review the results.   Testing/Procedures: Your physician has requested that you have an echocardiogram. Echocardiography is a painless test that uses sound waves to create images of your heart. It provides your doctor with information about the size and shape of your heart and how well your heart's chambers and valves are working. This procedure takes approximately one hour. There are no restrictions for this procedure. Please do NOT wear cologne, perfume, aftershave, or lotions (deodorant is allowed). Please arrive 15 minutes prior to your appointment time.  Please note: We ask at that you not bring children with you during ultrasound (echo/ vascular) testing. Due to room size and safety concerns, children are not allowed in the ultrasound rooms during exams. Our front office staff cannot provide observation of children in our lobby area while testing is being conducted. An adult accompanying a patient to their appointment will only be allowed in the ultrasound room at the discretion of the ultrasound technician under special circumstances. We apologize for any inconvenience.    Your physician has requested that you have cardiac CT. Cardiac computed tomography (CT) is a painless test that uses an x-ray machine to take clear, detailed pictures of your heart. For further information please visit  [email or web address] Please follow instruction sheet BELOW:    Your cardiac CT will be scheduled at one of the below locations:   [facility] [address] [place], [place] [address] [phone]  OR  [facility] [address] [place], [place] [address] [phone]  OR   [facility] [address] [place], [place] [address] [phone]  OR   [facility] [address] [place], [place] [address] [phone]  If scheduled at [facility], please arrive at the [facility] and Children's Entrance (Entrance C2) of [facility] 30 minutes prior to test start time. You can use the FREE valet parking offered at entrance C (encouraged to control the heart rate for the test)  Proceed to the [facility] Radiology Department (first floor) to check-in and test prep.  All radiology patients and guests should use entrance C2 at [facility], accessed from [facility], even though the hospital's physical address listed is [address].    If scheduled at [facility] or [facility], please arrive 15 mins early for check-in and test prep.  There is spacious parking and easy access to the radiology department from the [facility] entrance. Please enter here and check-in with the desk attendant.   If scheduled at [facility], please arrive 30 minutes early for check-in and test prep.  Please follow these instructions carefully (unless otherwise directed):  An IV will be required for this test and Nitroglycerin will be given.  Hold all erectile dysfunction medications at least  3 days (72 hrs) prior to test. (Ie viagra, cialis, sildenafil, tadalafil, etc)   On the Night Before the Test: Be sure to Drink plenty of water. Do not consume any caffeinated/decaffeinated beverages or  chocolate 12 hours prior to your test. Do not take any antihistamines 12 hours prior to your test.  On the Day of the Test: Drink plenty of water until 1 hour prior to the test. Do not eat any food 1 hour prior to test. You may take your regular medications prior to the test.  Take iVABRADINE (cORLANOR) 7.5 MG TAKING 2 TABLETS two hours prior to test.  FEMALES- please wear underwire-free bra if available, avoid dresses & tight clothing  After the Test: Drink plenty of water. After receiving IV contrast, you may experience a mild flushed feeling. This is normal. On occasion, you may experience a mild rash up to 24 hours after the test. This is not dangerous. If this occurs, you can take Benadryl 25 mg, Zyrtec, Claritin, or Allegra and increase your fluid intake. (Patients taking Tikosyn should avoid Benadryl, and may take Zyrtec, Claritin, or Allegra) If you experience trouble breathing, this can be serious. If it is severe call 911 IMMEDIATELY. If it is mild, please call our office.  We will call to schedule your test 2-4 weeks out understanding that some insurance companies will need an authorization prior to the service being performed.   For more information and frequently asked questions, please visit our website : [email or web address]  For non-scheduling related questions, please contact the cardiac imaging nurse navigator should you have any questions/concerns: Cardiac Imaging Nurse Navigators Direct Office Dial: [phone]   For scheduling needs, including cancellations and rescheduling, please call [place], [phone].    Follow-Up: At [facility], you and your health needs are our priority.  As part of our continuing mission to provide you with exceptional heart care, we have created designated Provider Care Teams.  These Care Teams include your primary Cardiologist (physician) and Advanced Practice Providers (APPs -  Physician Assistants and Nurse  Practitioners) who all work together to provide you with the care you need, when you need it.  We recommend signing up for the patient portal called "MyChart".  Sign up information is provided on this After Visit Summary.  MyChart is used to connect with patients for Virtual Visits (Telemedicine).  Patients are able to view lab/test results, encounter notes, upcoming appointments, etc.  Non-urgent messages can be sent to your provider as well.   To learn more about what you can do with MyChart, go to [email or web address].    Your next appointment:   DEPENDING ON TEST RESULTS  Provider:   Armanda Magic, MD     Other Instructions     1st Floor: - Lobby - Registration  - Pharmacy  - Lab - Cafe  2nd Floor: - PV Lab - Diagnostic Testing (echo, CT, nuclear med)  3rd Floor: - Vacant  4th Floor: - TCTS (cardiothoracic surgery) - AFib Clinic - Structural Heart Clinic - Vascular Surgery  - Vascular Ultrasound  5th Floor: - HeartCare Cardiology (general and EP) - Clinical Pharmacy for coumadin, hypertension, lipid, weight-loss medications, and med management appointments    Valet parking services will be available as well.

## 2024-01-03 DIAGNOSIS — F411 Generalized anxiety disorder: Secondary | ICD-10-CM | POA: Diagnosis not present

## 2024-01-10 DIAGNOSIS — F411 Generalized anxiety disorder: Secondary | ICD-10-CM | POA: Diagnosis not present

## 2024-01-15 DIAGNOSIS — F411 Generalized anxiety disorder: Secondary | ICD-10-CM | POA: Diagnosis not present

## 2024-01-16 DIAGNOSIS — F411 Generalized anxiety disorder: Secondary | ICD-10-CM | POA: Diagnosis not present

## 2024-01-17 ENCOUNTER — Ambulatory Visit (HOSPITAL_COMMUNITY): Attending: Cardiology

## 2024-01-17 DIAGNOSIS — R079 Chest pain, unspecified: Secondary | ICD-10-CM | POA: Insufficient documentation

## 2024-01-17 LAB — ECHOCARDIOGRAM COMPLETE
Area-P 1/2: 4.05 cm2
S' Lateral: 2.1 cm

## 2024-01-23 DIAGNOSIS — F411 Generalized anxiety disorder: Secondary | ICD-10-CM | POA: Diagnosis not present

## 2024-01-30 DIAGNOSIS — F411 Generalized anxiety disorder: Secondary | ICD-10-CM | POA: Diagnosis not present

## 2024-02-06 DIAGNOSIS — F411 Generalized anxiety disorder: Secondary | ICD-10-CM | POA: Diagnosis not present

## 2024-02-13 DIAGNOSIS — F411 Generalized anxiety disorder: Secondary | ICD-10-CM | POA: Diagnosis not present

## 2024-02-16 DIAGNOSIS — F411 Generalized anxiety disorder: Secondary | ICD-10-CM | POA: Diagnosis not present

## 2024-02-23 DIAGNOSIS — F411 Generalized anxiety disorder: Secondary | ICD-10-CM | POA: Diagnosis not present

## 2024-02-27 DIAGNOSIS — E7849 Other hyperlipidemia: Secondary | ICD-10-CM | POA: Diagnosis not present

## 2024-02-27 DIAGNOSIS — N1831 Chronic kidney disease, stage 3a: Secondary | ICD-10-CM | POA: Diagnosis not present

## 2024-02-27 DIAGNOSIS — E66811 Obesity, class 1: Secondary | ICD-10-CM | POA: Diagnosis not present

## 2024-02-27 DIAGNOSIS — Z6834 Body mass index (BMI) 34.0-34.9, adult: Secondary | ICD-10-CM | POA: Diagnosis not present

## 2024-03-01 DIAGNOSIS — F411 Generalized anxiety disorder: Secondary | ICD-10-CM | POA: Diagnosis not present

## 2024-03-08 DIAGNOSIS — F411 Generalized anxiety disorder: Secondary | ICD-10-CM | POA: Diagnosis not present

## 2024-03-15 DIAGNOSIS — F411 Generalized anxiety disorder: Secondary | ICD-10-CM | POA: Diagnosis not present

## 2024-03-20 DIAGNOSIS — F411 Generalized anxiety disorder: Secondary | ICD-10-CM | POA: Diagnosis not present

## 2024-03-29 ENCOUNTER — Ambulatory Visit
Admission: RE | Admit: 2024-03-29 | Discharge: 2024-03-29 | Disposition: A | Discharge status: Home / Self Care | Source: Ambulatory Visit | Attending: Internal Medicine | Admitting: Internal Medicine

## 2024-03-29 ENCOUNTER — Other Ambulatory Visit: Payer: Self-pay | Admitting: Internal Medicine

## 2024-03-29 DIAGNOSIS — F411 Generalized anxiety disorder: Secondary | ICD-10-CM | POA: Diagnosis not present

## 2024-03-29 DIAGNOSIS — G8929 Other chronic pain: Secondary | ICD-10-CM

## 2024-03-29 DIAGNOSIS — R7303 Prediabetes: Secondary | ICD-10-CM | POA: Diagnosis not present

## 2024-03-29 DIAGNOSIS — R7401 Elevation of levels of liver transaminase levels: Secondary | ICD-10-CM | POA: Diagnosis not present

## 2024-03-29 DIAGNOSIS — N1831 Chronic kidney disease, stage 3a: Secondary | ICD-10-CM | POA: Diagnosis not present

## 2024-03-29 DIAGNOSIS — M60872 Other myositis, left ankle and foot: Secondary | ICD-10-CM | POA: Diagnosis not present

## 2024-03-29 DIAGNOSIS — M7732 Calcaneal spur, left foot: Secondary | ICD-10-CM | POA: Diagnosis not present

## 2024-03-29 DIAGNOSIS — E6609 Other obesity due to excess calories: Secondary | ICD-10-CM | POA: Diagnosis not present

## 2024-03-29 DIAGNOSIS — H11433 Conjunctival hyperemia, bilateral: Secondary | ICD-10-CM | POA: Diagnosis not present

## 2024-03-29 DIAGNOSIS — I10 Essential (primary) hypertension: Secondary | ICD-10-CM | POA: Diagnosis not present

## 2024-03-29 DIAGNOSIS — Z6834 Body mass index (BMI) 34.0-34.9, adult: Secondary | ICD-10-CM | POA: Diagnosis not present

## 2024-03-29 DIAGNOSIS — M25572 Pain in left ankle and joints of left foot: Secondary | ICD-10-CM | POA: Diagnosis not present

## 2024-04-01 ENCOUNTER — Other Ambulatory Visit: Payer: Self-pay | Admitting: Internal Medicine

## 2024-04-01 DIAGNOSIS — R7401 Elevation of levels of liver transaminase levels: Secondary | ICD-10-CM

## 2024-04-02 ENCOUNTER — Ambulatory Visit
Admission: RE | Admit: 2024-04-02 | Discharge: 2024-04-02 | Discharge status: Home / Self Care | Source: Ambulatory Visit | Attending: Internal Medicine | Admitting: Internal Medicine

## 2024-04-02 DIAGNOSIS — R945 Abnormal results of liver function studies: Secondary | ICD-10-CM | POA: Diagnosis not present

## 2024-04-02 DIAGNOSIS — R7401 Elevation of levels of liver transaminase levels: Secondary | ICD-10-CM

## 2024-04-05 DIAGNOSIS — F411 Generalized anxiety disorder: Secondary | ICD-10-CM | POA: Diagnosis not present

## 2024-04-08 DIAGNOSIS — F411 Generalized anxiety disorder: Secondary | ICD-10-CM | POA: Diagnosis not present

## 2024-04-15 DIAGNOSIS — F411 Generalized anxiety disorder: Secondary | ICD-10-CM | POA: Diagnosis not present

## 2024-04-17 DIAGNOSIS — F411 Generalized anxiety disorder: Secondary | ICD-10-CM | POA: Diagnosis not present

## 2024-04-24 DIAGNOSIS — F411 Generalized anxiety disorder: Secondary | ICD-10-CM | POA: Diagnosis not present

## 2024-05-01 DIAGNOSIS — F411 Generalized anxiety disorder: Secondary | ICD-10-CM | POA: Diagnosis not present

## 2024-05-02 ENCOUNTER — Encounter: Payer: Self-pay | Admitting: Oncology

## 2024-05-08 DIAGNOSIS — F411 Generalized anxiety disorder: Secondary | ICD-10-CM | POA: Diagnosis not present

## 2024-05-15 DIAGNOSIS — E6609 Other obesity due to excess calories: Secondary | ICD-10-CM | POA: Diagnosis not present

## 2024-05-15 DIAGNOSIS — F411 Generalized anxiety disorder: Secondary | ICD-10-CM | POA: Diagnosis not present

## 2024-05-15 DIAGNOSIS — Z6834 Body mass index (BMI) 34.0-34.9, adult: Secondary | ICD-10-CM | POA: Diagnosis not present

## 2024-05-15 DIAGNOSIS — E66811 Obesity, class 1: Secondary | ICD-10-CM | POA: Diagnosis not present

## 2024-05-15 DIAGNOSIS — I10 Essential (primary) hypertension: Secondary | ICD-10-CM | POA: Diagnosis not present

## 2024-05-17 DIAGNOSIS — F411 Generalized anxiety disorder: Secondary | ICD-10-CM | POA: Diagnosis not present

## 2024-05-22 ENCOUNTER — Ambulatory Visit: Admitting: Podiatry

## 2024-05-24 DIAGNOSIS — F411 Generalized anxiety disorder: Secondary | ICD-10-CM | POA: Diagnosis not present

## 2024-05-29 DIAGNOSIS — F411 Generalized anxiety disorder: Secondary | ICD-10-CM | POA: Diagnosis not present

## 2024-06-05 DIAGNOSIS — F411 Generalized anxiety disorder: Secondary | ICD-10-CM | POA: Diagnosis not present

## 2024-06-10 ENCOUNTER — Ambulatory Visit (INDEPENDENT_AMBULATORY_CARE_PROVIDER_SITE_OTHER): Admitting: Podiatry

## 2024-06-10 ENCOUNTER — Encounter: Payer: Self-pay | Admitting: Podiatry

## 2024-06-10 ENCOUNTER — Ambulatory Visit (INDEPENDENT_AMBULATORY_CARE_PROVIDER_SITE_OTHER)

## 2024-06-10 VITALS — Ht 61.0 in | Wt 171.0 lb

## 2024-06-10 DIAGNOSIS — M778 Other enthesopathies, not elsewhere classified: Secondary | ICD-10-CM | POA: Diagnosis not present

## 2024-06-10 DIAGNOSIS — M7752 Other enthesopathy of left foot: Secondary | ICD-10-CM | POA: Diagnosis not present

## 2024-06-10 DIAGNOSIS — M7662 Achilles tendinitis, left leg: Secondary | ICD-10-CM | POA: Diagnosis not present

## 2024-06-10 MED ORDER — NITROGLYCERIN 0.2 MG/HR TD PT24: 0.2000 mg | MEDICATED_PATCH | Freq: Every day | TRANSDERMAL | 12 refills | Status: AC

## 2024-06-10 MED ORDER — DICLOFENAC SODIUM 75 MG PO TBEC: 75.0000 mg | DELAYED_RELEASE_TABLET | Freq: Two times a day (BID) | ORAL | 2 refills | Status: DC

## 2024-06-10 NOTE — Patient Instructions (Signed)

## 2024-06-11 NOTE — Progress Notes (Signed)
 Subjective:   Patient ID: Taylor Chen, female   DOB: 63 y.o.   MRN: [id number]   HPI Patient presents with a lot of pain in the big toe joint left foot that has been going on for at least a year and also is complaining about pain in the Achilles tendon left stating that she is trying to be more active and lose weight and it has been painful for her  Patient states she does not remember injury to it but it is getting worse and it is giving her trouble even before that for a fairly long.  Of time.  Patient has tried anti-inflammatories shoe gear modifications does not smoke likes to be active   Review of Systems  All other systems reviewed and are negative.       Objective:  Physical Exam Vitals and nursing note reviewed.  Constitutional:      Appearance: She is well-developed.  Pulmonary:     Effort: Pulmonary effort is normal.  Musculoskeletal:        General: Normal range of motion.  Skin:    General: Skin is warm.  Neurological:     Mental Status: She is alert.     Neurovascular status found to be intact muscle strength found to be adequate range of motion within normal limits.  Moderate discomfort in the first MPJ left no restriction of motion no crepitus mild discomfort and quite a bit of pain at the muscle tendon junction of the left Achilles with inflammation and fluid buildup no nodular formation felt.  Tendon appears strong good digital perfusion well-oriented x 3     Assessment:  Achilles tendinitis left at the muscle tendon junction with moderate equinus condition and mild discomfort of the first MPJ left foot     Plan:  H&P reviewed both conditions and x-rays which indicated small posterior spur or small plantar spur and reduced range of motion and joint space of the first MPJ.  At this point I prescribed Nitropatch I prescribed diclofenac  to take and I went ahead and I applied air fracture walker properly fitted to her lower leg to immobilize the Achilles and allow  for hopeful healing.  I would like to see her back in 4 weeks advised on ice therapy and gradual stretch and dispensed heel lifts

## 2024-06-12 DIAGNOSIS — F411 Generalized anxiety disorder: Secondary | ICD-10-CM | POA: Diagnosis not present

## 2024-06-19 DIAGNOSIS — F411 Generalized anxiety disorder: Secondary | ICD-10-CM | POA: Diagnosis not present

## 2024-06-26 DIAGNOSIS — F411 Generalized anxiety disorder: Secondary | ICD-10-CM | POA: Diagnosis not present

## 2024-07-01 ENCOUNTER — Ambulatory Visit (INDEPENDENT_AMBULATORY_CARE_PROVIDER_SITE_OTHER): Admitting: Podiatry

## 2024-07-01 ENCOUNTER — Encounter: Payer: Self-pay | Admitting: Podiatry

## 2024-07-01 VITALS — Ht 61.0 in | Wt 171.0 lb

## 2024-07-01 DIAGNOSIS — M7662 Achilles tendinitis, left leg: Secondary | ICD-10-CM | POA: Diagnosis not present

## 2024-07-01 DIAGNOSIS — M7752 Other enthesopathy of left foot: Secondary | ICD-10-CM

## 2024-07-01 NOTE — Progress Notes (Signed)
 Subjective:   Patient ID: Taylor Chen, female   DOB: 63 y.o.   MRN: [id number]   HPI Patient states her foot is improving and she has been wearing the boot every day and using the patches   ROS      Objective:  Physical Exam  Neurovascular status intact muscle strength posterior Achilles is good with dorsiflexion and plantarflexion adequate.  Patient has mild discomfort but it is localized and improved with no nodular formation currently     Assessment:  Inflammatory muscle tendon and Achilles tendinitis left with forefoot pain that is probably compensatory     Plan:  H&P reviewed at great length discussed the etiology of this possibility for MRI and the gradual reduction of the boot along with continued nitro patches for 30 more days and she will refill and I discussed heel lift therapy and patient will be seen back as symptoms indicate

## 2024-07-03 DIAGNOSIS — F411 Generalized anxiety disorder: Secondary | ICD-10-CM | POA: Diagnosis not present

## 2024-07-10 DIAGNOSIS — F411 Generalized anxiety disorder: Secondary | ICD-10-CM | POA: Diagnosis not present

## 2024-07-16 DIAGNOSIS — F411 Generalized anxiety disorder: Secondary | ICD-10-CM | POA: Diagnosis not present

## 2024-07-17 DIAGNOSIS — E6609 Other obesity due to excess calories: Secondary | ICD-10-CM | POA: Diagnosis not present

## 2024-07-17 DIAGNOSIS — N1831 Chronic kidney disease, stage 3a: Secondary | ICD-10-CM | POA: Diagnosis not present

## 2024-07-17 DIAGNOSIS — M6089 Other myositis, multiple sites: Secondary | ICD-10-CM | POA: Diagnosis not present

## 2024-07-17 DIAGNOSIS — Z6834 Body mass index (BMI) 34.0-34.9, adult: Secondary | ICD-10-CM | POA: Diagnosis not present

## 2024-07-19 DIAGNOSIS — F411 Generalized anxiety disorder: Secondary | ICD-10-CM | POA: Diagnosis not present

## 2024-07-26 DIAGNOSIS — F411 Generalized anxiety disorder: Secondary | ICD-10-CM | POA: Diagnosis not present

## 2024-08-03 DIAGNOSIS — F411 Generalized anxiety disorder: Secondary | ICD-10-CM | POA: Diagnosis not present

## 2024-08-05 ENCOUNTER — Ambulatory Visit: Admitting: Podiatry

## 2024-08-09 ENCOUNTER — Encounter: Payer: Self-pay | Admitting: Podiatry

## 2024-08-09 ENCOUNTER — Ambulatory Visit: Admitting: Podiatry

## 2024-08-09 VITALS — Ht 61.0 in | Wt 171.0 lb

## 2024-08-09 DIAGNOSIS — M7751 Other enthesopathy of right foot: Secondary | ICD-10-CM | POA: Diagnosis not present

## 2024-08-09 DIAGNOSIS — F411 Generalized anxiety disorder: Secondary | ICD-10-CM | POA: Diagnosis not present

## 2024-08-12 DIAGNOSIS — H25813 Combined forms of age-related cataract, bilateral: Secondary | ICD-10-CM | POA: Diagnosis not present

## 2024-08-16 DIAGNOSIS — F411 Generalized anxiety disorder: Secondary | ICD-10-CM | POA: Diagnosis not present

## 2024-08-23 DIAGNOSIS — F411 Generalized anxiety disorder: Secondary | ICD-10-CM | POA: Diagnosis not present

## 2024-08-30 DIAGNOSIS — F411 Generalized anxiety disorder: Secondary | ICD-10-CM | POA: Diagnosis not present

## 2024-09-04 DIAGNOSIS — F411 Generalized anxiety disorder: Secondary | ICD-10-CM | POA: Diagnosis not present

## 2024-09-05 ENCOUNTER — Other Ambulatory Visit: Payer: Self-pay | Admitting: Podiatry

## 2024-09-09 ENCOUNTER — Ambulatory Visit: Admitting: Dermatology

## 2024-09-11 DIAGNOSIS — F411 Generalized anxiety disorder: Secondary | ICD-10-CM | POA: Diagnosis not present

## 2024-09-12 MED ORDER — TRIAMCINOLONE ACETONIDE 10 MG/ML IJ SUSP: 10.0000 mg | Freq: Once | INTRAMUSCULAR | Status: AC

## 2024-09-12 NOTE — Progress Notes (Signed)
 Subjective:   Patient ID: Taylor Chen, female   DOB: 63 y.o.   MRN: [id number]   HPI Patient states she developed inflammation around the metatarsal phalangeal joint right with soreness.  Doing well with left ankle neuro   ROS      Objective:  Physical Exam  Vascular status intact inflammation around the first MPJ right moderate fluid involvement     Assessment:  Inflammatory capsulitis     Plan:  H&P reviewed sterile prep injected around the joint surface periarticular after explaining risk 3 mg dexamethasone  Kenalog  5 mg Xylocaine  applied sterile dressing

## 2024-09-15 DIAGNOSIS — F411 Generalized anxiety disorder: Secondary | ICD-10-CM | POA: Diagnosis not present

## 2024-09-20 DIAGNOSIS — Z6834 Body mass index (BMI) 34.0-34.9, adult: Secondary | ICD-10-CM | POA: Diagnosis not present

## 2024-09-20 DIAGNOSIS — Z1211 Encounter for screening for malignant neoplasm of colon: Secondary | ICD-10-CM | POA: Diagnosis not present

## 2024-09-20 DIAGNOSIS — Z1231 Encounter for screening mammogram for malignant neoplasm of breast: Secondary | ICD-10-CM | POA: Diagnosis not present

## 2024-09-20 DIAGNOSIS — F411 Generalized anxiety disorder: Secondary | ICD-10-CM | POA: Diagnosis not present

## 2024-09-20 DIAGNOSIS — F418 Other specified anxiety disorders: Secondary | ICD-10-CM | POA: Diagnosis not present

## 2024-09-20 DIAGNOSIS — N1831 Chronic kidney disease, stage 3a: Secondary | ICD-10-CM | POA: Diagnosis not present

## 2024-09-20 DIAGNOSIS — Z124 Encounter for screening for malignant neoplasm of cervix: Secondary | ICD-10-CM | POA: Diagnosis not present

## 2024-09-20 DIAGNOSIS — Z0001 Encounter for general adult medical examination with abnormal findings: Secondary | ICD-10-CM | POA: Diagnosis not present

## 2024-09-26 DIAGNOSIS — F411 Generalized anxiety disorder: Secondary | ICD-10-CM | POA: Diagnosis not present

## 2024-09-29 ENCOUNTER — Other Ambulatory Visit: Payer: Self-pay | Admitting: Podiatry

## 2024-10-03 DIAGNOSIS — F411 Generalized anxiety disorder: Secondary | ICD-10-CM | POA: Diagnosis not present

## 2024-10-07 ENCOUNTER — Ambulatory Visit: Admitting: Dermatology

## 2024-10-11 DIAGNOSIS — F411 Generalized anxiety disorder: Secondary | ICD-10-CM | POA: Diagnosis not present

## 2024-10-15 DIAGNOSIS — F411 Generalized anxiety disorder: Secondary | ICD-10-CM | POA: Diagnosis not present

## 2024-10-21 ENCOUNTER — Ambulatory Visit: Admitting: Dermatology

## 2024-10-21 ENCOUNTER — Encounter: Payer: Self-pay | Admitting: Dermatology

## 2024-10-21 VITALS — BP 122/79 | HR 90 | Temp 97.6°F

## 2024-10-21 DIAGNOSIS — D485 Neoplasm of uncertain behavior of skin: Secondary | ICD-10-CM

## 2024-10-21 DIAGNOSIS — D2362 Other benign neoplasm of skin of left upper limb, including shoulder: Secondary | ICD-10-CM | POA: Diagnosis not present

## 2024-10-21 NOTE — Patient Instructions (Addendum)
 Steri-Strip Wound Care Instructions Keep the bandage clean, dry, and in place for 48 hours. (72 hours if you take blood thinners). After removing the bandage: Leave the Steri-Strips in place Do not pull, tug, or rub the Steri-Strips You may shower with the Steri-Strips in place Gently clean the area with mild soap and water  Pat the area dry with a clean towel or cloth Do not remove the Steri-Strips. They will fall off on their own, usually within 10-14 days.  IN THE EVENT OF AN AFTER HOURS EMERGENCY (PAIN, BLEEDING, INFECTION), PLEASE CALL OUR OFFICE AND YOU BE DIRECTED TO THE ON CALL STAFF. MYCHART MESSAGES SENT AFTER HOURS WILL BE ADDRESSED ON THE NEXT BUSINESS DAY   What to Watch For After Surgery Bleeding Some light bleeding is normal in the first 24 hours. To lower bleeding risk: Do not lift more than 10 pounds for 1 week If surgery was on your face, head, or neck, do not bend or stoop for 72 hours If surgery was on an arm or leg, keep it raised as much as possible (Limit standing and walking if it was on the leg) If bleeding happens: Press firmly on the area for 30 minutes without looking If bleeding continues, press again for another 30 minutes Call the office if bleeding does not stop  Swelling and Bruising Swelling and bruising are normal. Use an ice pack for 15 minutes each hour during the first 1-2 days Wrap ice in a towel to keep bandages dry Keep the area raised when possible Use extra pillows if surgery was on the head or neck Raise arms or legs near heart level if surgery was there  Infection (Uncommon) Call the office if you notice: Increasing pain Redness or swelling Yellow drainage (pus) Symptoms starting several days after surgery  Pain Control Some pain is normal. Rest, ice, and elevation help You may take Tylenol  (acetaminophen ) and Ibuprofen (Advil/Motrin) Do not take aspirin for pain If you already take daily aspirin, continue it, but do not add  extra Safe option (if allowed): Take 2 ibuprofen (200 mg each) 3 hours later take 2 Tylenol  (325 mg each) Keep alternating every 3 hours as needed If you cannot take ibuprofen: Take Tylenol  650 mg every 4-6 hours EXAMPLE: Time Medicine Dose  6:00 AM Ibuprofen 400 mg (2 tablets of 200 mg)  9:00 AM Tylenol  650 mg (2 tablets of 325 mg)  12:00 PM Ibuprofen 400 mg  3:00 PM Tylenol  650 mg  6:00 PM Ibuprofen 400 mg  9:00 PM Tylenol  650 mg    Healing and Scar Questions When will my scar fade? It takes about 1 year for the scar to mature. Redness usually fades over time. Can my wound open? Yes, the area is weak for the first 6 months. Avoid pulling or stretching it. When will feeling return? Numbness or tingling can last 1-2 years. Some numbness may be permanent. Nose stuffiness If surgery was on your nose, stuffiness can last several months and will slowly improve. When can I stop wound care? You may stop once the skin is fully healed with no open areas. Makeup and sunscreen You may use makeup and sunscreen once: Stitches are removed, and The wound is fully healed Use sunscreen daily on healed skin.  Lumps, Puffiness, and Massage Small lumps under the skin are normal and will go away A small pimple along the scar can happen Use warm compresses Call if it does not improve Puffy scars can sometimes be treated -- call  the office if concerned After 1 month, gently massage the scar 2-3 times a day  Scar Products No Scar products should be applied until 1 month after your surgery Scar creams are optional Plain Vaseline works very well and is safe Stop any product that causes irritation  Future Skin Checks You may develop skin cancer again. See your dermatologist every 6-12 months Regular skin checks help find problems early  Important Information  Due to recent changes in healthcare laws, you may see results of your pathology and/or laboratory studies on MyChart before the  doctors have had a chance to review them. We understand that in some cases there may be results that are confusing or concerning to you. Please understand that not all results are received at the same time and often the doctors may need to interpret multiple results in order to provide you with the best plan of care or course of treatment. Therefore, we ask that you please give us  2 business days to thoroughly review all your results before contacting the office for clarification. Should we see a critical lab result, you will be contacted sooner.   If You Need Anything After Your Visit  If you have any questions or concerns for your doctor, please call our main line at [phone] If no one answers, please leave a voicemail as directed and we will return your call as soon as possible. Messages left after 4 pm will be answered the following business day.   You may also send us  a message via MyChart. We typically respond to MyChart messages within 1-2 business days.  For prescription refills, please ask your pharmacy to contact our office. Our fax number is [phone].  If you have an urgent issue when the clinic is closed that cannot wait until the next business day, you can page your doctor at the number below.    Please note that while we do our best to be available for urgent issues outside of office hours, we are not available 24/7.   If you have an urgent issue and are unable to reach us , you may choose to seek medical care at your doctor's office, retail clinic, urgent care center, or emergency room.  If you have a medical emergency, please immediately call 911 or go to the emergency department. In the event of inclement weather, please call our main line at [phone] for an update on the status of any delays or closures.  Dermatology Medication Tips: Please keep the boxes that topical medications come in in order to help keep track of the instructions about where and how to use  these. Pharmacies typically print the medication instructions only on the boxes and not directly on the medication tubes.   If your medication is too expensive, please contact our office at [phone] or send us  a message through MyChart.   We are unable to tell what your co-pay for medications will be in advance as this is different depending on your insurance coverage. However, we may be able to find a substitute medication at lower cost or fill out paperwork to get insurance to cover a needed medication.   If a prior authorization is required to get your medication covered by your insurance company, please allow us  1-2 business days to complete this process.  Drug prices often vary depending on where the prescription is filled and some pharmacies may offer cheaper prices.  The website www.goodrx.com contains coupons for medications through different pharmacies. The prices here do not  account for what the cost may be with help from insurance (it may be cheaper with your insurance), but the website can give you the price if you did not use any insurance.  - You can print the associated coupon and take it with your prescription to the pharmacy.  - You may also stop by our office during regular business hours and pick up a GoodRx coupon card.  - If you need your prescription sent electronically to a different pharmacy, notify our office through [facility] or by phone at [phone]

## 2024-10-21 NOTE — Progress Notes (Signed)
 "  Follow-Up Visit   Subjective  Taylor Chen is a 64 y.o. 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   The following portions of the chart were reviewed this encounter and updated as appropriate: medications, allergies, medical history  Review of Systems:  No other skin or systemic complaints except as noted in HPI or Assessment and Plan.  Pt reports lesion has been present for at least two years and denies previous treatment. Pt denies pain and itchiness.   Objective  Well appearing patient in no apparent distress; mood and affect are within normal limits.  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 Relevant physical exam findings are noted in the Assessment and Plan.  Left Forearm - Anterior   Assessment & Plan   NEOPLASM OF UNCERTAIN BEHAVIOR OF SKIN Left Forearm - Anterior - Skin excision  Excision method:  elliptical Lesion length (cm):  0.8 Lesion width (cm):  0.6 Margin per side (cm):  0.2 Total excision diameter (cm):  1.2 Informed consent: discussed and consent obtained   Timeout: patient name, date of birth, surgical site, and procedure verified   Procedure prep:  Patient was prepped and draped in usual sterile fashion Prep type:  Chlorhexidine  Anesthesia: the lesion was anesthetized in a standard fashion   Anesthetic:  1% lidocaine  w/ epinephrine 1-100,000 buffered w/ 8.4% NaHCO3 (8 cc) Instrument used: #15 blade   Hemostasis achieved with: suture, pressure and electrodesiccation   Outcome: patient tolerated procedure well with no complications   Post-procedure details: sterile dressing applied and wound care instructions given   Dressing type: pressure dressing and bandage    - Skin repair Complexity:  Complex Final length (cm):  2.2 Informed consent: discussed and consent obtained   Timeout: patient name, date of birth, surgical site, and  procedure verified   Procedure prep:  Patient was prepped and draped in usual sterile fashion Prep type:  Chlorhexidine  Anesthesia: the lesion was anesthetized in a standard fashion   Anesthetic:  1% lidocaine  w/ epinephrine 1-100,000 buffered w/ 8.4% NaHCO3 Reason for type of repair: reduce the risk of dehiscence, infection, and necrosis, preserve normal anatomy, preserve normal anatomical and functional relationships and avoid adjacent structures   Undermining: area extensively undermined   Subcutaneous layers (deep stitches):  Suture size:  4-0 Suture type: Vicryl Rapide (coated polyglactin 910)   Stitches:  Buried vertical mattress Fine/surface layer approximation (top stitches):  Suture type: cyanoacrylate tissue glue   Hemostasis achieved with: suture, pressure and electrodesiccation Outcome: patient tolerated procedure well with no complications   Post-procedure details: sterile dressing applied and wound care instructions given   Dressing type: pressure dressing and bandage (Steri Strips)    Specimen 1 - Surgical pathology Differential Diagnosis: DF  Check Margins: No  The surgical wound was then cleaned, prepped, and re-anesthetized as above. Wound edges were undermined extensively along at least one entire edge and at a distance equal to or greater than the width of the defect (see wound defect size above) in order to achieve closure and decrease wound tension and anatomic distortion. Redundant tissue repair including standing cone removal was performed. Hemostasis was achieved with electrocautery. Subcutaneous and epidermal tissues were approximated with the above sutures. The surgical site was then lightly scrubbed with sterile, saline-soaked gauze. Steri-strips were applied, and the area was then bandaged using Vaseline ointment, non-adherent gauze, gauze pads, and tape to provide an adequate pressure dressing.  The patient tolerated the procedure well, was given detailed written  and verbal wound care instructions, and was discharged in good condition.   The patient will follow-up: PRN.  Return for next excision.  LILLETTE Virgle Boards, Dermatology Tech am acting as a scribe for RUFUS CHRISTELLA HOLY, MD.    Documentation: I have reviewed the above documentation for accuracy and completeness, and I agree with the above.  RUFUS CHRISTELLA HOLY, MD  "

## 2024-10-22 ENCOUNTER — Ambulatory Visit: Payer: Self-pay | Admitting: Dermatology

## 2024-10-22 LAB — SURGICAL PATHOLOGY

## 2024-10-31 ENCOUNTER — Encounter: Payer: Self-pay | Admitting: Dermatology

## 2024-11-04 ENCOUNTER — Encounter: Payer: Self-pay | Admitting: Dermatology

## 2024-11-04 ENCOUNTER — Ambulatory Visit: Admitting: Dermatology

## 2024-11-04 VITALS — BP 128/89 | HR 64 | Temp 98.4°F

## 2024-11-04 DIAGNOSIS — D239 Other benign neoplasm of skin, unspecified: Secondary | ICD-10-CM

## 2024-11-04 DIAGNOSIS — L905 Scar conditions and fibrosis of skin: Secondary | ICD-10-CM | POA: Diagnosis not present

## 2024-11-04 DIAGNOSIS — D489 Neoplasm of uncertain behavior, unspecified: Secondary | ICD-10-CM | POA: Diagnosis not present

## 2024-11-04 DIAGNOSIS — D2372 Other benign neoplasm of skin of left lower limb, including hip: Secondary | ICD-10-CM

## 2024-11-04 NOTE — Patient Instructions (Signed)

## 2024-11-04 NOTE — Progress Notes (Signed)
 "  Follow-Up Visit   Subjective  Taylor Chen is a 64 y.o. female who presents for the following: Excision for neoplasm of skin on the left lower leg leg.   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   The following portions of the chart were reviewed this encounter and updated as appropriate: medications, allergies, medical history  Review of Systems:  No other skin or systemic complaints except as noted in HPI or Assessment and Plan.  Pt reports lesion has been present for a few years and denies previous treatment. Pt denies pain and itchiness.   Objective  Well appearing patient in no apparent distress; mood and affect are within normal limits.  A focused examination was performed of the following areas: Left leg Left arm  Relevant physical exam findings are noted in the Assessment and Plan.   Left Lower Leg - Anterior Dark brown nodule   Assessment & Plan   NEOPLASM OF UNCERTAIN BEHAVIOR Left Lower Leg - Anterior - Skin excision  Excision method:  elliptical Lesion length (cm):  1.5 Lesion width (cm):  1 Margin per side (cm):  0.1 Total excision diameter (cm):  1.7 Informed consent: discussed and consent obtained   Timeout: patient name, date of birth, surgical site, and procedure verified   Procedure prep:  Patient was prepped and draped in usual sterile fashion Prep type:  Chlorhexidine  Anesthesia: the lesion was anesthetized in a standard fashion   Anesthetic:  1% lidocaine  w/ epinephrine 1-100,000 buffered w/ 8.4% NaHCO3 Instrument used: #15 blade   Hemostasis achieved with: suture, pressure and electrodesiccation   Outcome: patient tolerated procedure well with no complications   Post-procedure details: sterile dressing applied and wound care instructions given   Dressing type: pressure dressing and bandage    - Skin repair Complexity:  Complex Final length (cm):  2.8 Informed consent:  discussed and consent obtained   Timeout: patient name, date of birth, surgical site, and procedure verified   Procedure prep:  Patient was prepped and draped in usual sterile fashion Prep type:  Chlorhexidine  Anesthesia: the lesion was anesthetized in a standard fashion   Anesthetic:  1% lidocaine  w/ epinephrine 1-100,000 buffered w/ 8.4% NaHCO3 Reason for type of repair: reduce the risk of dehiscence, infection, and necrosis, allow closure of the large defect and preserve normal anatomy   Undermining: area extensively undermined   Subcutaneous layers (deep stitches):  Suture size:  3-0 Suture type: Vicryl (polyglactin 910)   Stitches:  Buried vertical mattress Fine/surface layer approximation (top stitches):  Suture type: cyanoacrylate tissue glue   Hemostasis achieved with: suture, pressure and electrodesiccation Outcome: patient tolerated procedure well with no complications   Post-procedure details: sterile dressing applied and wound care instructions given   Dressing type: pressure dressing and bandage    Specimen 1 - Surgical pathology Differential Diagnosis: dermatofibroma vs other  Check Margins: No SCAR   DERMATOFIBROMA    The surgical wound was then cleaned, prepped, and re-anesthetized as above. Wound edges were undermined extensively along at least one entire edge and at a distance equal to or greater than the width of the defect (see wound defect size above) in order to achieve closure and decrease wound tension and anatomic distortion. Redundant tissue repair including standing cone removal was performed. Hemostasis was achieved with electrocautery. Subcutaneous and epidermal tissues were approximated with the above sutures. The surgical site was then lightly scrubbed with sterile, saline-soaked  gauze. Steri-strips were applied, and the area was then bandaged using Vaseline ointment, non-adherent gauze, gauze pads, and tape to provide an adequate pressure dressing. The  patient tolerated the procedure well, was given detailed written and verbal wound care instructions, and was discharged in good condition.   The patient will follow-up: PRN.  Scar s/p WLE for DF on left forearm, treated on 10/21/2024, repaired with linear closure - Reassured that wound has healed well - Discussed that scars take up to 12 months to mature from the date of surgery - Recommend SPF 30+ to scar daily to prevent purple color - OK to start scar massage at 4-6 weeks post-op - Can consider silicone based products for scar healing  DERMATOFIBROMA Exam: Firm pink/brown papulenodule with dimple sign.  Treatment Plan: A dermatofibroma is a benign growth possibly related to trauma, such as an insect bite, cut from shaving, or inflamed acne-type bump.  Treatment options to remove include shave or excision with resulting scar and risk of recurrence.  Since benign-appearing and not bothersome, will observe for now.    Return if symptoms worsen or fail to improve.  I, Doyce Pan, CMA, am acting as scribe for RUFUS CHRISTELLA HOLY, MD.    Documentation: I have reviewed the above documentation for accuracy and completeness, and I agree with the above.  RUFUS CHRISTELLA HOLY, MD  "

## 2024-11-05 ENCOUNTER — Ambulatory Visit: Payer: Self-pay | Admitting: Dermatology

## 2024-11-05 LAB — SURGICAL PATHOLOGY

## 2024-12-04 ENCOUNTER — Ambulatory Visit: Admitting: Physician Assistant
